# Patient Record
Sex: Female | Born: 1960 | Race: Black or African American | Hispanic: No | Marital: Single | Smoking: Never smoker
Health system: Southern US, Community
[De-identification: ages and names within clinical notes are randomized; demographics above are authoritative.]

## PROBLEM LIST (undated history)

## (undated) DIAGNOSIS — C259 Malignant neoplasm of pancreas, unspecified: Secondary | ICD-10-CM

## (undated) DIAGNOSIS — J45909 Unspecified asthma, uncomplicated: Secondary | ICD-10-CM

## (undated) DIAGNOSIS — C50912 Malignant neoplasm of unspecified site of left female breast: Secondary | ICD-10-CM

## (undated) DIAGNOSIS — C50919 Malignant neoplasm of unspecified site of unspecified female breast: Secondary | ICD-10-CM

## (undated) HISTORY — PX: ABDOMINAL HYSTERECTOMY: SHX81

## (undated) HISTORY — PX: SENTINEL NODE BIOPSY: SHX6608

## (undated) HISTORY — DX: Malignant neoplasm of unspecified site of left female breast: C50.912

---

## 2006-10-17 ENCOUNTER — Ambulatory Visit: Payer: Self-pay | Admitting: Family Medicine

## 2007-06-27 ENCOUNTER — Emergency Department: Payer: Self-pay

## 2007-06-27 ENCOUNTER — Other Ambulatory Visit: Payer: Self-pay

## 2008-01-11 ENCOUNTER — Emergency Department: Payer: Self-pay | Admitting: Emergency Medicine

## 2008-01-11 ENCOUNTER — Other Ambulatory Visit: Payer: Self-pay

## 2008-08-25 ENCOUNTER — Emergency Department: Payer: Self-pay | Admitting: Emergency Medicine

## 2008-09-07 ENCOUNTER — Emergency Department: Payer: Self-pay

## 2009-02-24 ENCOUNTER — Emergency Department: Payer: Self-pay | Admitting: Emergency Medicine

## 2009-10-02 ENCOUNTER — Inpatient Hospital Stay: Payer: Self-pay | Admitting: Internal Medicine

## 2011-12-02 ENCOUNTER — Emergency Department: Payer: Self-pay | Admitting: *Deleted

## 2011-12-02 LAB — CBC
HCT: 34.3 % — ABNORMAL LOW (ref 35.0–47.0)
HGB: 11.4 g/dL — ABNORMAL LOW (ref 12.0–16.0)
MCH: 28.4 pg (ref 26.0–34.0)
MCHC: 33.3 g/dL (ref 32.0–36.0)
MCV: 85 fL (ref 80–100)
RBC: 4.03 10*6/uL (ref 3.80–5.20)
RDW: 15.6 % — ABNORMAL HIGH (ref 11.5–14.5)

## 2011-12-02 LAB — COMPREHENSIVE METABOLIC PANEL
Alkaline Phosphatase: 96 U/L (ref 50–136)
Bilirubin,Total: 0.4 mg/dL (ref 0.2–1.0)
Chloride: 110 mmol/L — ABNORMAL HIGH (ref 98–107)
Co2: 23 mmol/L (ref 21–32)
EGFR (Non-African Amer.): >60
Osmolality: 289 (ref 275–301)
SGOT(AST): 50 U/L — ABNORMAL HIGH (ref 15–37)
SGPT (ALT): 39 U/L
Sodium: 146 mmol/L — ABNORMAL HIGH (ref 136–145)
Total Protein: 7.7 g/dL (ref 6.4–8.2)

## 2011-12-03 LAB — LIPASE, BLOOD: Lipase: 159 U/L (ref 73–393)

## 2012-10-20 ENCOUNTER — Emergency Department: Payer: Self-pay | Admitting: Emergency Medicine

## 2012-10-20 LAB — CBC
HCT: 36 % (ref 35.0–47.0)
MCH: 28.2 pg (ref 26.0–34.0)
MCHC: 33.5 g/dL (ref 32.0–36.0)
Platelet: 215 10*3/uL (ref 150–440)
RBC: 4.28 10*6/uL (ref 3.80–5.20)
RDW: 16.2 % — ABNORMAL HIGH (ref 11.5–14.5)
WBC: 7 10*3/uL (ref 3.6–11.0)

## 2012-10-20 LAB — COMPREHENSIVE METABOLIC PANEL
Albumin: 4 g/dL (ref 3.4–5.0)
Alkaline Phosphatase: 110 U/L (ref 50–136)
Anion Gap: 7 (ref 7–16)
BUN: 8 mg/dL (ref 7–18)
Calcium, Total: 8.9 mg/dL (ref 8.5–10.1)
Chloride: 111 mmol/L — ABNORMAL HIGH (ref 98–107)
Co2: 24 mmol/L (ref 21–32)
EGFR (African American): >60
Glucose: 105 mg/dL — ABNORMAL HIGH (ref 65–99)
SGOT(AST): 45 U/L — ABNORMAL HIGH (ref 15–37)
SGPT (ALT): 35 U/L (ref 12–78)
Sodium: 142 mmol/L (ref 136–145)
Total Protein: 8 g/dL (ref 6.4–8.2)

## 2012-10-20 LAB — URINALYSIS, COMPLETE
Bacteria: NONE SEEN
Bilirubin,UR: NEGATIVE
Glucose,UR: NEGATIVE mg/dL (ref 0–75)
Ketone: NEGATIVE
Leukocyte Esterase: NEGATIVE
Ph: 6 (ref 4.5–8.0)
RBC,UR: NONE SEEN /HPF (ref 0–5)
Squamous Epithelial: 1
WBC UR: NONE SEEN /HPF (ref 0–5)

## 2012-10-20 LAB — DRUG SCREEN, URINE
Amphetamines, Ur Screen: NEGATIVE (ref ?–1000)
Benzodiazepine, Ur Scrn: NEGATIVE (ref ?–200)
Cannabinoid 50 Ng, Ur ~~LOC~~: NEGATIVE (ref ?–50)
Cocaine Metabolite,Ur ~~LOC~~: NEGATIVE (ref ?–300)
MDMA (Ecstasy)Ur Screen: NEGATIVE (ref ?–500)
Phencyclidine (PCP) Ur S: NEGATIVE (ref ?–25)

## 2012-10-20 LAB — ETHANOL: Ethanol: 324 mg/dL

## 2013-01-13 ENCOUNTER — Emergency Department: Payer: Self-pay | Admitting: Emergency Medicine

## 2013-04-12 ENCOUNTER — Emergency Department: Payer: Self-pay | Admitting: Internal Medicine

## 2013-06-02 ENCOUNTER — Emergency Department: Payer: Self-pay | Admitting: Emergency Medicine

## 2013-06-02 LAB — COMPREHENSIVE METABOLIC PANEL
Albumin: 4 g/dL (ref 3.4–5.0)
Anion Gap: 11 (ref 7–16)
BUN: 15 mg/dL (ref 7–18)
Calcium, Total: 9.4 mg/dL (ref 8.5–10.1)
Chloride: 109 mmol/L — ABNORMAL HIGH (ref 98–107)
Co2: 22 mmol/L (ref 21–32)
Creatinine: 0.81 mg/dL (ref 0.60–1.30)
EGFR (African American): >60
EGFR (Non-African Amer.): >60
Glucose: 100 mg/dL — ABNORMAL HIGH (ref 65–99)
Potassium: 3.8 mmol/L (ref 3.5–5.1)
SGOT(AST): 20 U/L (ref 15–37)
SGPT (ALT): 26 U/L (ref 12–78)
Sodium: 142 mmol/L (ref 136–145)
Total Protein: 8.2 g/dL (ref 6.4–8.2)

## 2013-06-02 LAB — CBC
HCT: 37.3 % (ref 35.0–47.0)
HGB: 12.5 g/dL (ref 12.0–16.0)
MCH: 28.7 pg (ref 26.0–34.0)
MCHC: 33.4 g/dL (ref 32.0–36.0)
RBC: 4.34 10*6/uL (ref 3.80–5.20)
RDW: 14.5 % (ref 11.5–14.5)
WBC: 6.8 10*3/uL (ref 3.6–11.0)

## 2013-06-02 LAB — LIPASE, BLOOD: Lipase: 117 U/L (ref 73–393)

## 2013-06-02 LAB — ETHANOL: Ethanol: 299 mg/dL

## 2013-06-02 LAB — TROPONIN I: Troponin-I: <0.02 ng/mL

## 2013-09-27 ENCOUNTER — Emergency Department: Payer: Self-pay | Admitting: Internal Medicine

## 2014-03-31 ENCOUNTER — Emergency Department: Payer: Self-pay | Admitting: Emergency Medicine

## 2014-03-31 LAB — COMPREHENSIVE METABOLIC PANEL
ALK PHOS: 93 U/L
Albumin: 4.2 g/dL (ref 3.4–5.0)
Anion Gap: 5 — ABNORMAL LOW (ref 7–16)
BUN: 14 mg/dL (ref 7–18)
Bilirubin,Total: 0.7 mg/dL (ref 0.2–1.0)
CHLORIDE: 107 mmol/L (ref 98–107)
CO2: 26 mmol/L (ref 21–32)
CREATININE: 0.73 mg/dL (ref 0.60–1.30)
Calcium, Total: 10.1 mg/dL (ref 8.5–10.1)
GLUCOSE: 115 mg/dL — AB (ref 65–99)
OSMOLALITY: 277 (ref 275–301)
POTASSIUM: 3.8 mmol/L (ref 3.5–5.1)
SGOT(AST): 35 U/L (ref 15–37)
SGPT (ALT): 35 U/L
Sodium: 138 mmol/L (ref 136–145)
Total Protein: 8.5 g/dL — ABNORMAL HIGH (ref 6.4–8.2)

## 2014-03-31 LAB — CBC WITH DIFFERENTIAL/PLATELET
BASOS ABS: 0 10*3/uL (ref 0.0–0.1)
BASOS PCT: 0.2 %
Eosinophil #: 0.1 10*3/uL (ref 0.0–0.7)
Eosinophil %: 1.7 %
HCT: 41.9 % (ref 35.0–47.0)
HGB: 13.7 g/dL (ref 12.0–16.0)
LYMPHS PCT: 8.9 %
Lymphocyte #: 0.6 10*3/uL — ABNORMAL LOW (ref 1.0–3.6)
MCH: 28.7 pg (ref 26.0–34.0)
MCHC: 32.6 g/dL (ref 32.0–36.0)
MCV: 88 fL (ref 80–100)
MONO ABS: 0.3 x10 3/mm (ref 0.2–0.9)
MONOS PCT: 4 %
NEUTROS PCT: 85.2 %
Neutrophil #: 6 10*3/uL (ref 1.4–6.5)
PLATELETS: 217 10*3/uL (ref 150–440)
RBC: 4.76 10*6/uL (ref 3.80–5.20)
RDW: 14.8 % — AB (ref 11.5–14.5)
WBC: 7 10*3/uL (ref 3.6–11.0)

## 2014-03-31 LAB — URINALYSIS, COMPLETE
Bacteria: NONE SEEN
Bilirubin,UR: NEGATIVE
Glucose,UR: NEGATIVE mg/dL (ref 0–75)
LEUKOCYTE ESTERASE: NEGATIVE
Nitrite: NEGATIVE
PH: 5 (ref 4.5–8.0)
PROTEIN: NEGATIVE
RBC,UR: 1 /HPF (ref 0–5)
SPECIFIC GRAVITY: 1.025 (ref 1.003–1.030)
Squamous Epithelial: 1
WBC UR: <1 /HPF (ref 0–5)

## 2014-03-31 LAB — LIPASE, BLOOD: Lipase: 94 U/L (ref 73–393)

## 2014-03-31 LAB — TROPONIN I: Troponin-I: <0.02 ng/mL

## 2014-04-19 ENCOUNTER — Ambulatory Visit: Payer: Self-pay | Admitting: Cardiovascular Disease

## 2014-04-19 ENCOUNTER — Encounter: Payer: Self-pay | Admitting: *Deleted

## 2014-07-09 DIAGNOSIS — C50919 Malignant neoplasm of unspecified site of unspecified female breast: Secondary | ICD-10-CM

## 2014-07-09 HISTORY — DX: Malignant neoplasm of unspecified site of unspecified female breast: C50.919

## 2014-07-28 ENCOUNTER — Emergency Department: Payer: Self-pay | Admitting: Emergency Medicine

## 2014-07-28 LAB — CBC
HCT: 41.2 % (ref 35.0–47.0)
HGB: 13.2 g/dL (ref 12.0–16.0)
MCH: 28.1 pg (ref 26.0–34.0)
MCHC: 32.1 g/dL (ref 32.0–36.0)
MCV: 87 fL (ref 80–100)
Platelet: 208 10*3/uL (ref 150–440)
RBC: 4.71 10*6/uL (ref 3.80–5.20)
RDW: 16.3 % — ABNORMAL HIGH (ref 11.5–14.5)
WBC: 5.3 10*3/uL (ref 3.6–11.0)

## 2014-07-28 LAB — COMPREHENSIVE METABOLIC PANEL
ANION GAP: 7 (ref 7–16)
AST: 86 U/L — AB (ref 15–37)
Albumin: 3.8 g/dL (ref 3.4–5.0)
Alkaline Phosphatase: 89 U/L
BILIRUBIN TOTAL: 0.5 mg/dL (ref 0.2–1.0)
BUN: 7 mg/dL (ref 7–18)
CHLORIDE: 104 mmol/L (ref 98–107)
CREATININE: 0.48 mg/dL — AB (ref 0.60–1.30)
Calcium, Total: 9.8 mg/dL (ref 8.5–10.1)
Co2: 28 mmol/L (ref 21–32)
EGFR (Non-African Amer.): >60
Glucose: 80 mg/dL (ref 65–99)
Osmolality: 274 (ref 275–301)
POTASSIUM: 4.5 mmol/L (ref 3.5–5.1)
SGPT (ALT): 43 U/L
SODIUM: 139 mmol/L (ref 136–145)
TOTAL PROTEIN: 8.4 g/dL — AB (ref 6.4–8.2)

## 2014-07-28 LAB — TROPONIN I

## 2014-07-28 LAB — CK TOTAL AND CKMB (NOT AT ARMC)
CK, TOTAL: 279 U/L — AB (ref 26–192)
CK-MB: 1.7 ng/mL (ref 0.5–3.6)

## 2014-08-24 ENCOUNTER — Ambulatory Visit: Payer: Self-pay

## 2014-09-07 ENCOUNTER — Ambulatory Visit
Admit: 2014-09-07 | Disposition: A | Discharge status: Home / Self Care | Payer: Self-pay | Attending: Internal Medicine | Admitting: Internal Medicine

## 2014-09-27 ENCOUNTER — Ambulatory Visit: Payer: Self-pay | Admitting: Surgery

## 2014-09-30 ENCOUNTER — Ambulatory Visit: Payer: Self-pay | Admitting: Surgery

## 2014-09-30 HISTORY — PX: MASTECTOMY: SHX3

## 2014-10-02 LAB — CREATININE, SERUM
Creatinine: 0.58 mg/dL
EGFR (Non-African Amer.): >60

## 2014-10-07 ENCOUNTER — Emergency Department
Admit: 2014-10-07 | Disposition: A | Discharge status: Home / Self Care | Payer: Self-pay | Admitting: Emergency Medicine

## 2014-10-07 LAB — COMPREHENSIVE METABOLIC PANEL
ALBUMIN: 4 g/dL
ALK PHOS: 68 U/L
Anion Gap: 7 (ref 7–16)
BILIRUBIN TOTAL: 0.2 mg/dL — AB
BUN: 11 mg/dL
CALCIUM: 9.5 mg/dL
CHLORIDE: 110 mmol/L
CREATININE: 0.56 mg/dL
Co2: 24 mmol/L
EGFR (Non-African Amer.): >60
Glucose: 88 mg/dL
Potassium: 4 mmol/L
SGOT(AST): 29 U/L
SGPT (ALT): 28 U/L
SODIUM: 141 mmol/L
TOTAL PROTEIN: 7.4 g/dL

## 2014-10-07 LAB — CBC WITH DIFFERENTIAL/PLATELET
BASOS ABS: 0 10*3/uL (ref 0.0–0.1)
Basophil %: 0.4 %
Eosinophil #: 0.3 10*3/uL (ref 0.0–0.7)
Eosinophil %: 3.5 %
HCT: 37.6 % (ref 35.0–47.0)
HGB: 12.3 g/dL (ref 12.0–16.0)
LYMPHS PCT: 37.8 %
Lymphocyte #: 2.8 10*3/uL (ref 1.0–3.6)
MCH: 28.4 pg (ref 26.0–34.0)
MCHC: 32.7 g/dL (ref 32.0–36.0)
MCV: 87 fL (ref 80–100)
MONOS PCT: 7 %
Monocyte #: 0.5 x10 3/mm (ref 0.2–0.9)
NEUTROS PCT: 51.3 %
Neutrophil #: 3.8 10*3/uL (ref 1.4–6.5)
Platelet: 229 10*3/uL (ref 150–440)
RBC: 4.32 10*6/uL (ref 3.80–5.20)
RDW: 14.9 % — AB (ref 11.5–14.5)
WBC: 7.3 10*3/uL (ref 3.6–11.0)

## 2014-10-08 ENCOUNTER — Ambulatory Visit
Admit: 2014-10-08 | Disposition: A | Discharge status: Home / Self Care | Payer: Self-pay | Attending: Internal Medicine | Admitting: Internal Medicine

## 2014-10-27 ENCOUNTER — Emergency Department: Admit: 2014-10-27 | Disposition: A | Discharge status: Home / Self Care | Payer: Self-pay | Admitting: Student

## 2014-10-27 LAB — COMPREHENSIVE METABOLIC PANEL
ALBUMIN: 4.5 g/dL
ALT: 29 U/L
AST: 58 U/L — AB
Alkaline Phosphatase: 94 U/L
Anion Gap: 13 (ref 7–16)
BUN: 7 mg/dL
Bilirubin,Total: 0.6 mg/dL
Calcium, Total: 9.7 mg/dL
Chloride: 106 mmol/L
Co2: 23 mmol/L
Creatinine: 0.63 mg/dL
EGFR (African American): >60
EGFR (Non-African Amer.): >60
GLUCOSE: 116 mg/dL — AB
POTASSIUM: 4 mmol/L
Sodium: 142 mmol/L
Total Protein: 8.2 g/dL — ABNORMAL HIGH

## 2014-10-27 LAB — ETHANOL: Ethanol: 336 mg/dL

## 2014-10-27 LAB — CBC
HCT: 36.9 % (ref 35.0–47.0)
HGB: 12.2 g/dL (ref 12.0–16.0)
MCH: 28 pg (ref 26.0–34.0)
MCHC: 33 g/dL (ref 32.0–36.0)
MCV: 85 fL (ref 80–100)
Platelet: 179 10*3/uL (ref 150–440)
RBC: 4.35 10*6/uL (ref 3.80–5.20)
RDW: 14.3 % (ref 11.5–14.5)
WBC: 5.9 10*3/uL (ref 3.6–11.0)

## 2014-10-27 LAB — CK TOTAL AND CKMB (NOT AT ARMC)
CK, Total: 238 U/L — ABNORMAL HIGH
CK-MB: 2.3 ng/mL

## 2014-10-27 LAB — TROPONIN I: Troponin-I: <0.03 ng/mL

## 2014-10-27 LAB — PROTIME-INR
INR: 0.9
Prothrombin Time: 12.1 secs

## 2014-10-27 LAB — APTT: Activated PTT: 28.9 secs (ref 23.6–35.9)

## 2014-10-27 LAB — LIPASE, BLOOD: Lipase: 33 U/L

## 2014-11-01 LAB — SURGICAL PATHOLOGY

## 2014-11-07 NOTE — Op Note (Signed)
PATIENT NAME:  Alexis Hernandez, Alexis Hernandez MR#:  [id number] DATE OF BIRTH:  29-Aug-1960  DATE OF PROCEDURE:  09/30/2014  PREOPERATIVE DIAGNOSIS: Left breast carcinoma.   POSTOPERATIVE DIAGNOSIS: Left breast carcinoma.   OPERATION: Left simple mastectomy with sentinel lymph node biopsy x2.    SURGEON:  Rodena Goldmann, III, MD   ASSISTANT: PA S  ANESTHESIA: General.   OPERATIVE PROCEDURE: With the patient in the supine position after induction of appropriate general anesthesia, the patient's left breast was prepped with ChloraPrep and draped with sterile towels. An elliptical incision was made around the nipple and carried down through the subcutaneous tissue with Bovie electrocautery. Traction sutures of 3-0 silk were placed on both flaps. The superior flap was created down to the second intercostal space on the chest wall and the inferior flap down just below the inframammary fold on the chest wall. The breast was swept off the chest wall from medial to lateral, stopping just before the latissimus dorsi muscle. The specimen was marked and sent to pathology.   The axilla was interrogated with the Neoprobe and 2 lymph nodes were identified with significant counts. No remaining counts were identified in the area after the lymph nodes were removed.  A touch prep  did not reveal any evidence of malignancy in the identified lymph nodes. The area was copiously irrigated. The chest wall was sprayed with Evicel.   Drains were placed through the inferior flap, 1 to the axilla, and 1 to the superior flap. Skin incisions were then closed with interrupted vertical mattress sutures of 4-0 nylon. The drain was secured with 3-0 nylon. Compressive dressing applied.   The patient was taken to the recovery room having tolerated the procedure well.   Sponge, instruments and needle counts were correct x2 in the operating room.    ____________________________ Rodena Goldmann III, MD rle:nt D: 09/30/2014 11:45:46  ET T: 09/30/2014 20:56:44 ET JOB#: [id number]  cc: Rodena Goldmann III, MD, <Dictator> Rodena Goldmann MD ELECTRONICALLY SIGNED 10/05/2014 20:47

## 2014-11-07 NOTE — Discharge Summary (Signed)
PATIENT NAME:  Alexis Hernandez, SANCHO MR#:  [id number] DATE OF BIRTH:  1960/07/15  DATE OF ADMISSION:  09/30/2014 DATE OF DISCHARGE:   10/02/2014  BRIEF HISTORY: Ms. Kilcrease is a 54 year old woman with a recently diagnosed small left breast carcinoma. After appropriate preoperative preparation and informed consent, she was taken to surgery on the morning of 09/30/2014, where she underwent a left simple mastectomy with sentinel lymph node biopsy.  Touch preps in the operating room and pathology demonstrated no evidence of any significant malignancy in her lymph nodes. The procedure was otherwise uncomplicated. She had some mild pain control issues and some mild nausea issues postoperatively. At the present time though, she is up, active, tolerating a diet with no complaints.  Wounds look good. There is no sign of any infection. Discharged home today to be followed in the office in 7-10 days' time. Bathing, activity, and driving instructions were given to the patient. She can resume her home medications which include tramadol 50 mg every 4 hours p.r.n. and albuterol inhaler 2 puffs 4 times a day p.r.n.  Her pain medicine will be Percocet 5/325 every 4-6 hours p.r.n. pain.   FINAL DISCHARGE DIAGNOSIS: Breast cancer surgery, mastectomy with sentinel lymph node biopsy.    ____________________________ Rodena Goldmann III, MD rle:tr D: 10/02/2014 11:20:58 ET T: 10/02/2014 13:47:42 ET JOB#: [id number]  cc: Rodena Goldmann III, MD, <Dictator> Rodena Goldmann MD ELECTRONICALLY SIGNED 10/05/2014 20:47

## 2014-11-11 ENCOUNTER — Other Ambulatory Visit: Payer: Self-pay

## 2014-11-11 DIAGNOSIS — C50912 Malignant neoplasm of unspecified site of left female breast: Secondary | ICD-10-CM

## 2014-11-18 ENCOUNTER — Other Ambulatory Visit: Payer: Self-pay

## 2014-11-19 ENCOUNTER — Other Ambulatory Visit: Payer: Self-pay

## 2014-11-19 DIAGNOSIS — C50912 Malignant neoplasm of unspecified site of left female breast: Secondary | ICD-10-CM

## 2014-11-24 ENCOUNTER — Ambulatory Visit: Payer: Self-pay | Attending: Internal Medicine

## 2014-12-02 ENCOUNTER — Emergency Department
Admission: EM | Admit: 2014-12-02 | Discharge: 2014-12-02 | Disposition: A | Discharge status: Home / Self Care | Payer: Medicaid Other | Attending: Emergency Medicine | Admitting: Emergency Medicine

## 2014-12-02 ENCOUNTER — Encounter: Payer: Self-pay | Admitting: Emergency Medicine

## 2014-12-02 DIAGNOSIS — M7052 Other bursitis of knee, left knee: Secondary | ICD-10-CM | POA: Diagnosis not present

## 2014-12-02 DIAGNOSIS — Z880 Allergy status to penicillin: Secondary | ICD-10-CM | POA: Diagnosis not present

## 2014-12-02 DIAGNOSIS — Z79899 Other long term (current) drug therapy: Secondary | ICD-10-CM | POA: Insufficient documentation

## 2014-12-02 DIAGNOSIS — M1712 Unilateral primary osteoarthritis, left knee: Secondary | ICD-10-CM | POA: Insufficient documentation

## 2014-12-02 DIAGNOSIS — Y9389 Activity, other specified: Secondary | ICD-10-CM | POA: Insufficient documentation

## 2014-12-02 DIAGNOSIS — M719 Bursopathy, unspecified: Secondary | ICD-10-CM

## 2014-12-02 DIAGNOSIS — M1990 Unspecified osteoarthritis, unspecified site: Secondary | ICD-10-CM

## 2014-12-02 DIAGNOSIS — M25562 Pain in left knee: Secondary | ICD-10-CM | POA: Diagnosis present

## 2014-12-02 MED ORDER — OXYCODONE-ACETAMINOPHEN 5-325 MG PO TABS: 1.0000 | ORAL_TABLET | ORAL | Status: DC | PRN

## 2014-12-02 MED ORDER — KETOROLAC TROMETHAMINE 10 MG PO TABS: 10.0000 mg | ORAL_TABLET | Freq: Four times a day (QID) | ORAL | Status: DC | PRN

## 2014-12-02 MED ORDER — KETOROLAC TROMETHAMINE 10 MG PO TABS
10.0000 mg | ORAL_TABLET | Freq: Once | ORAL | Status: AC
  Administered 2014-12-02: 10 mg via ORAL

## 2014-12-02 MED ORDER — KETOROLAC TROMETHAMINE 10 MG PO TABS
ORAL_TABLET | ORAL | Status: AC
  Administered 2014-12-02: 10 mg via ORAL
  Filled 2014-12-02: qty 1

## 2014-12-02 NOTE — ED Notes (Signed)
Pt to triage with c/o left knee pain.  States has had pain like this in the past.  No known injury.  Pt states pain started approximately 3 days ago

## 2014-12-02 NOTE — ED Provider Notes (Signed)
CSN: [id number]     Arrival date & time 12/02/14  1819 History   First MD Initiated Contact with Patient 12/02/14 1939     Chief Complaint  Patient presents with  . Knee Pain     (Consider location/radiation/quality/duration/timing/severity/associated sxs/prior Treatment) HPI Patient is had worsening left knee pain states his this started about 3 days ago rates it about a 6 to a 10 out of 10 says she has mild swelling to the front of her knee feels like it's grinding a little bit states that this is bothered her off and on in the past denies any trauma denies any lower extremity swelling leaving her leg still and resting exacerbated with movement and touch to the area no other complaints at this time Past Medical History  Diagnosis Date  . Cancer     breast   Past Surgical History  Procedure Laterality Date  . Mastectomy    . Abdominal hysterectomy     No family history on file. History  Substance Use Topics  . Smoking status: Never Smoker   . Smokeless tobacco: Not on file  . Alcohol Use: No   OB History    No data available     Review of Systems  Constitutional: Negative.   HENT: Negative.   Eyes: Negative.   Respiratory: Negative.   Cardiovascular: Negative.   Gastrointestinal: Negative.   Genitourinary: Negative.   Skin: Negative.   Neurological: Negative.   Hematological: Negative.   All other systems reviewed and are negative.     Allergies  Penicillins  Home Medications   Prior to Admission medications   Medication Sig Start Date End Date Taking? Authorizing Provider  albuterol (PROVENTIL HFA;VENTOLIN HFA) 108 (90 BASE) MCG/ACT inhaler Inhale 2 puffs into the lungs every 6 (six) hours as needed for wheezing or shortness of breath.   Yes Historical Provider, MD  esomeprazole (NEXIUM) 40 MG capsule Take 40 mg by mouth daily at 12 noon.   Yes Historical Provider, MD  traMADol (ULTRAM) 50 MG tablet Take 50 mg by mouth every 6 (six) hours as needed for  moderate pain.   Yes Historical Provider, MD  ketorolac (TORADOL) 10 MG tablet Take 1 tablet (10 mg total) by mouth every 6 (six) hours as needed. 12/02/14   Krystalle Pilkington Luanna Cole Nickalas Mccarrick, PA-C  oxyCODONE-acetaminophen (ROXICET) 5-325 MG per tablet Take 1 tablet by mouth every 4 (four) hours as needed for moderate pain or severe pain. 12/02/14   Caral Whan William C Keayra Graham, PA-C   BP 107/72 mmHg  Pulse 90  Temp(Src) 98 F (36.7 C) (Oral)  Resp 18  Ht 5\' 2"  (1.575 m)  Wt 143 lb (64.864 kg)  BMI 26.15 kg/m2  SpO2 97% Physical Exam Female appearing stated age well-developed well-nourished no acute distress vitals reviewed Head ears eyes nose next throat examinations patient was unremarkable  cardiovascular regular rate and rhythm no murmurs rubs gallops good peripheral pulses throughout extremities Pulmonary lungs clear to auscultation bilaterally Skin appears free of rash disease  Musculoskeletal patient has some prepatellar edema mild crepitus with motion of her knee but full range of motion negative Homans sign bilaterally no pedal edema  Neuro exam nonfocal she is able to ambulate without difficulty  ED Course  Procedures Ace wrap applied left knee)   MDM  The patient with a history of arthritis in her left knee states now she has a little bit of mild swelling prepatellar feels like something is grinding in her knee denies any  trauma she has no calf or lower extremity swelling will start her on NSAID and something for pain as well as recommended ice and compression to the area discussed with her feels like she is developed a bursitis and have her follow-up with orthopedics Final diagnoses:  Bursitis  Arthritis        Jb Dulworth Verdene Rio, PA-C 12/02/14 2030  Nance Pear, MD 12/02/14 2232

## 2015-02-28 ENCOUNTER — Other Ambulatory Visit: Payer: Self-pay | Admitting: *Deleted

## 2015-02-28 ENCOUNTER — Inpatient Hospital Stay: Payer: Medicaid Other | Attending: Internal Medicine

## 2015-02-28 ENCOUNTER — Inpatient Hospital Stay (HOSPITAL_BASED_OUTPATIENT_CLINIC_OR_DEPARTMENT_OTHER): Payer: Medicaid Other | Admitting: Internal Medicine

## 2015-02-28 VITALS — BP 173/82 | HR 56 | Temp 96.5°F | Resp 18 | Ht 62.0 in | Wt 148.6 lb

## 2015-02-28 DIAGNOSIS — Z170 Estrogen receptor positive status [ER+]: Secondary | ICD-10-CM | POA: Diagnosis not present

## 2015-02-28 DIAGNOSIS — Z79811 Long term (current) use of aromatase inhibitors: Secondary | ICD-10-CM

## 2015-02-28 DIAGNOSIS — Z13820 Encounter for screening for osteoporosis: Secondary | ICD-10-CM | POA: Insufficient documentation

## 2015-02-28 DIAGNOSIS — Z801 Family history of malignant neoplasm of trachea, bronchus and lung: Secondary | ICD-10-CM | POA: Insufficient documentation

## 2015-02-28 DIAGNOSIS — R740 Nonspecific elevation of levels of transaminase and lactic acid dehydrogenase [LDH]: Secondary | ICD-10-CM | POA: Insufficient documentation

## 2015-02-28 DIAGNOSIS — Z9012 Acquired absence of left breast and nipple: Secondary | ICD-10-CM | POA: Insufficient documentation

## 2015-02-28 DIAGNOSIS — C50512 Malignant neoplasm of lower-outer quadrant of left female breast: Secondary | ICD-10-CM | POA: Insufficient documentation

## 2015-02-28 DIAGNOSIS — D696 Thrombocytopenia, unspecified: Secondary | ICD-10-CM | POA: Diagnosis not present

## 2015-02-28 DIAGNOSIS — Z79899 Other long term (current) drug therapy: Secondary | ICD-10-CM | POA: Diagnosis not present

## 2015-02-28 DIAGNOSIS — C50912 Malignant neoplasm of unspecified site of left female breast: Secondary | ICD-10-CM

## 2015-02-28 LAB — CBC WITH DIFFERENTIAL/PLATELET
BASOS ABS: 0 10*3/uL (ref 0–0.1)
BASOS PCT: 0 %
EOS PCT: 2 %
Eosinophils Absolute: 0.1 10*3/uL (ref 0–0.7)
HCT: 38.4 % (ref 35.0–47.0)
Hemoglobin: 12.8 g/dL (ref 12.0–16.0)
Lymphocytes Relative: 37 %
Lymphs Abs: 2 10*3/uL (ref 1.0–3.6)
MCH: 28.8 pg (ref 26.0–34.0)
MCHC: 33.4 g/dL (ref 32.0–36.0)
MCV: 86.2 fL (ref 80.0–100.0)
MONO ABS: 0.4 10*3/uL (ref 0.2–0.9)
Monocytes Relative: 8 %
Neutro Abs: 2.9 10*3/uL (ref 1.4–6.5)
Neutrophils Relative %: 53 %
PLATELETS: 128 10*3/uL — AB (ref 150–440)
RBC: 4.46 MIL/uL (ref 3.80–5.20)
RDW: 13.8 % (ref 11.5–14.5)
WBC: 5.4 10*3/uL (ref 3.6–11.0)

## 2015-02-28 LAB — HEPATIC FUNCTION PANEL
ALBUMIN: 4.4 g/dL (ref 3.5–5.0)
ALT: 28 U/L (ref 14–54)
AST: 42 U/L — AB (ref 15–41)
Alkaline Phosphatase: 77 U/L (ref 38–126)
Bilirubin, Direct: <0.1 mg/dL — ABNORMAL LOW (ref 0.1–0.5)
TOTAL PROTEIN: 8 g/dL (ref 6.5–8.1)
Total Bilirubin: 0.7 mg/dL (ref 0.3–1.2)

## 2015-02-28 LAB — CREATININE, SERUM: Creatinine, Ser: 0.66 mg/dL (ref 0.44–1.00)

## 2015-02-28 NOTE — Progress Notes (Signed)
Patient states that she has a constant pain/ache on her left side. She rates the pain a 8/10 because it is constant. She also states that her appetite has been up and down and she has been having trouble sleeping. She states that her last mammogram was in March 2016.

## 2015-03-07 ENCOUNTER — Ambulatory Visit: Payer: Medicaid Other | Attending: Internal Medicine

## 2015-03-15 ENCOUNTER — Ambulatory Visit
Admission: RE | Admit: 2015-03-15 | Discharge: 2015-03-15 | Disposition: A | Discharge status: Home / Self Care | Payer: Medicaid Other | Source: Ambulatory Visit | Attending: Internal Medicine | Admitting: Internal Medicine

## 2015-03-15 DIAGNOSIS — M8580 Other specified disorders of bone density and structure, unspecified site: Secondary | ICD-10-CM | POA: Diagnosis not present

## 2015-03-15 DIAGNOSIS — Z13820 Encounter for screening for osteoporosis: Secondary | ICD-10-CM

## 2015-03-15 DIAGNOSIS — C50912 Malignant neoplasm of unspecified site of left female breast: Secondary | ICD-10-CM

## 2015-03-16 NOTE — Progress Notes (Signed)
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  Initially went to emergency room for left-sided chest pain, and CT chest showed a 2.4 cm breast mass following which she had mammogram and ultrasound of the left breast on February 16 which measured the mass at 13 x 9 x 8 mm size with no axillary lymphadenopathy.  09/30/14 - Simple Mastectomy and Sentinel Lymph Node Study: Surgical pathology reported multifocal pT1b (6 mm) pN0 (sn) grade 2 invasive ductal carcinoma with uninvolved margins, DCIS present with uninvolved margins.  10/13/14 Oncotype DX assay reports recurrence score of 4, 10-year risk of distant recurrence is around 4%. Started adjuvant hormonal therapy with anastrozole in May 2016.   HISTORY OF PRESENT ILLNESS:  Patient returns for continued oncology follow-up, she was last seen a few months ago. Patient is status post simple mastectomy and sentinel lymph node study in March. She was started on anastrozole in May, states that she is tolerating this without any new side effects. Denies any bothersome hot flashes. No joint stiffness or new joint pains. No new bone pains. No new dyspnea, cough or chest pain. Remains physically active. Denies feeling any breast masses on self exam.  REVIEW OF SYSTEMS:   ROS As in HPI above. In addition, no fevers or sweats. No new headaches or focal weakness.  No sore throat, cough, shortness of breath, sputum, hemoptysis or chest pain. No dizziness or palpitation. No abdominal pain, constipation, diarrhea, dysuria or hematuria. No new skin rash or bleeding symptoms. No new paresthesias in  extremities.   PAST MEDICAL HISTORY: Reviewed. Past Medical History  Diagnosis Date  . Cancer     breast    PAST SURGICAL HISTORY: Reviewed. Past Surgical History  Procedure Laterality Date  . Mastectomy    . Abdominal hysterectomy      FAMILY HISTORY: Reviewed. Father had lung cancer, cousin had throat cancer. Otherwise denies any history of breast, colon, or ovarian malignancies in the family.  SOCIAL HISTORY: Reviewed. Social History  Substance Use Topics  . Smoking status: Never Smoker   . Smokeless tobacco: Not on file  . Alcohol Use: No    Allergies  Allergen Reactions  . Penicillins Swelling    Current Outpatient Prescriptions  Medication Sig Dispense Refill  . albuterol (PROVENTIL HFA;VENTOLIN HFA) 108 (90 BASE) MCG/ACT inhaler Inhale 2 puffs into the lungs every 6 (six) hours as needed for wheezing or shortness of breath.    . esomeprazole (NEXIUM) 40 MG capsule Take 40 mg by mouth daily at 12 noon.    Marland Kitchen ketorolac (TORADOL) 10 MG tablet Take 1 tablet (10 mg total) by mouth every 6 (six) hours as needed. (Patient not taking: Reported on 02/28/2015) 20 tablet 0  . oxyCODONE-acetaminophen (ROXICET) 5-325 MG per tablet Take 1 tablet by mouth every 4 (four) hours as needed for moderate pain or severe pain. (Patient not taking: Reported on 02/28/2015) 10 tablet 0  . traMADol (ULTRAM) 50 MG tablet Take 50 mg by mouth every 6 (six) hours as needed for moderate pain.     No current facility-administered medications for this visit.    PHYSICAL EXAM: Filed  Vitals:   02/28/15 1029  BP: 173/82  Pulse: 56  Temp: 96.5 F (35.8 C)  Resp: 18     Body mass index is 27.17 kg/(m^2).    ECOG FS:0 - Asymptomatic  GENERAL: Patient is alert and oriented and in no acute distress. There is no icterus. HEENT: EOMs intact. No cervical lymphadenopathy. CVS: S1S2, regular LUNGS: Bilaterally clear to auscultation, no rhonchi. ABDOMEN: Soft, nontender. No hepatomegaly clinically.    NEURO: grossly nonfocal, cranial nerves are intact.  EXTREMITIES: No pedal edema. BREASTS: no dominant masses palpable on either side. No axillary adenopathy on either side. Exam performed in presence of a nurse.   LAB RESULTS:    Component Value Date/Time   NA 142 10/27/2014 2153   K 4.0 10/27/2014 2153   CL 106 10/27/2014 2153   CO2 23 10/27/2014 2153   GLUCOSE 116* 10/27/2014 2153   BUN 7 10/27/2014 2153   CREATININE 0.66 02/28/2015 1004   CREATININE 0.63 10/27/2014 2153   CALCIUM 9.7 10/27/2014 2153   PROT 8.0 02/28/2015 1004   PROT 8.2* 10/27/2014 2153   ALBUMIN 4.4 02/28/2015 1004   ALBUMIN 4.5 10/27/2014 2153   AST 42* 02/28/2015 1004   AST 58* 10/27/2014 2153   ALT 28 02/28/2015 1004   ALT 29 10/27/2014 2153   ALKPHOS 77 02/28/2015 1004   ALKPHOS 94 10/27/2014 2153   BILITOT 0.7 02/28/2015 1004   BILITOT 0.6 10/27/2014 2153   GFRNONAA >60 02/28/2015 1004   GFRNONAA >60 10/27/2014 2153   GFRNONAA >60 07/28/2014 1715   GFRAA >60 02/28/2015 1004   GFRAA >60 10/27/2014 2153   GFRAA >60 07/28/2014 1715    Lab Results  Component Value Date   WBC 5.4 02/28/2015   NEUTROABS 2.9 02/28/2015   HGB 12.8 02/28/2015   HCT 38.4 02/28/2015   MCV 86.2 02/28/2015   PLT 128* 02/28/2015     STUDIES: 07/28/14 - CT chest. IMPRESSION: There is a left breast lesion along the inferior aspect, measuring up to 2.4 cm. Recommend further evaluation with mammography. No evidence for a chest wall mass. No acute cardiopulmonary findings.  08/24/14 - Left US/mammogram. Targeted ultrasound is performed, showing a hypoechoic somewhat ill-defined oval mass in the 4 o'clock position of the left breast, 5 cm from the nipple, corresponding to the palpable abnormality. This measures 8 mm x 9 mm by 13 mm in size. There other vague areas of heterogeneous echogenicity without an additional discrete mass. There are no axillary masses or abnormal appearing lymph nodes.   IMPRESSION: Findings are  suspicious for breast carcinoma with an abnormal lobulated area density in the inferior lateral left breast, associated pleomorphic calcification and an abnormal appearing hypoechoic lesion on ultrasound. Biopsy is indicated. RECOMMENDATION: Ultrasound-guided core needle biopsy of the hypoechoic mass in the left breast 4 o'clock, 5 cm from the nipple.  08/26/14 - Left breast core biopsy. DIAGNOSIS: A. BREAST, LEFT AT 4:00; ULTRASOUND GUIDED CORE BIOPSY: INVASIVE MAMMARY CARCINOMA, NO SPECIAL TYPE WITH PROMINENT NECROSIS. INDETERMINATE FOR ANGIOLYMPHATIC INVASION. ER+, PR+, Her2neu negative (1+ on IHC).  09/30/14 - Simple Mastectomy and SLN study Surgical Pathology report. DIAGNOSIS: A. LEFT BREAST; SIMPLE MASTECTOMY: MULTIFOCAL INVASIVE MAMMARY CARCINOMA, LARGEST FOCUS MEASURING 6 MM. EXTENSIVE DUCTAL CARCINOMA IN SITU, SOLID, CRIBRIFORM, MICROPAPILLARY TYPES WITH MICROCALCIFICATIONS. THE MARGINS OF EXCISION ARE NEGATIVE. SEE CANCER CASE SUMMARY BELOW. NO TUMOR SEEN IN ONE LATERAL INTRAMAMMARY LYMPH NODE (0/1). B. LYMPH NODE, SENTINEL 1 AND 2; EXCISION:  NO TUMOR SEEN IN TWO SENTINEL LYMPH NODES (  0/1). Histologic Type: Invasive ductal carcinoma (no special type or not otherwise specified). Histologic Grade:  Overall Grade 2. DCIS is present Size of Largest Invasive Carcinoma: 0.6cm Tumor Focality: Multiple foci of invasive carcinoma Invasive Carcinoma: Margins uninvolved by invasive carcinoma. Distance from Closest Margin: 89m. Ductal Carcinoma In Situ (DCIS):   Margins uninvolved by DCIS. istance of DCIS from Closest Margin is < 1 mm. LYMPH NODES: Total Number of Lymph Nodes Examined: 3 Micro / Macro Metastases: Not identified. Number of Lymph Nodes with Isolated Tumor Cells (<= 0.2 mm and <= 200 cells): None Number of Sentinel Lymph Nodes Examined: 2. ACCESSORY FINDINGS Lymph-Vascular Invasion: Not Identified STAGE (pTNM):  m (multiple foci of invasive carcinoma) Primary Tumor (Invasive Carcinoma)  (pT): pT1b:  Tumor > 5 mm but <= 10 mm in greatest dimension. Regional Lymph Nodes: pN0: No regional lymph node metastasis identified.    ASSESSMENT / PLAN:   1. Stage I left breast invasive mammary carcinoma grade 2 on biopsy, ER/PR positive and HER2/neu negative (1+ on IHC by core biopsy of left breast mass located at 4 o'clock position done on 08/26/14). On 09/30/14 - Simple Mastectomy and Sentinel Lymph Node Study: Surgical pathology reported multifocal pT1b (6 mm) pN0 (sn) grade 2 invasive ductal carcinoma with uninvolved margins, DCIS present with uninvolved margins. On 10/13/14 Oncotype DX assay reported recurrence score of 4, 10-year risk of distant recurrence is around 4%. Started hormonal therapy with anastrozole in May 2016  -  reviewed labs from today and discussed with patient. Clinically doing steady, tolerating anastrozole without major side effects, continue this 1 mg by mouth daily. Patient explained that this is planned for total course of 5 years. Clinically no evidence to suggest recurrent/metastatic breast cancer, plan is continued surveillance and will see her back in about 6 months with repeat labs, will obtain right breast mammogram prior to this visit. 2. Will get DEXA scan for Osteoporosis surveillance, patient on high-risk medication (on aromatase inhibitor). She was reminded to continue to take calcium plus vitamin D supplement twice daily. 3. Thrombocytopenia, mildly elevated liver transaminases - patient gives history of regular alcohol intake which could be a possible explanation for these findings, I have recommended workup to evaluate for other etiologies. Patient wants to hold off on this and states that she will quit alcohol intake. Will repeat labs in 4 weeks including CBC and LFT and if there is persistent abnormalities she will need further workup. 4. In between visits, patient advised to call in case of any new breast masses felt on self-exam, side effects from anastrozole  or other symptoms and will be evaluated sooner. She is agreeable to this plan.     SLeia Alf MD   03/16/2015 8:29 AM

## 2015-03-18 ENCOUNTER — Telehealth: Payer: Self-pay | Admitting: *Deleted

## 2015-03-18 MED ORDER — CALCIUM-VITAMIN D 250-125 MG-UNIT PO TABS: 1.0000 | ORAL_TABLET | Freq: Every day | ORAL | Status: AC

## 2015-03-18 NOTE — Telephone Encounter (Signed)
Called inquiring about dexa scan results. I informed her that she is osteopenic and she is to take Calium + Vitamin D 2 times a day every day. She asked if it is OTC and I told her yes and that she can have her pharmacist help her choose a combination of the two so that she does not have to have 2 different pills I reccommended Oscal generic to her. She had me to sell osteopenic for her

## 2015-03-28 ENCOUNTER — Inpatient Hospital Stay: Payer: Medicaid Other | Attending: Internal Medicine

## 2015-03-28 DIAGNOSIS — Z13820 Encounter for screening for osteoporosis: Secondary | ICD-10-CM

## 2015-03-28 DIAGNOSIS — C50912 Malignant neoplasm of unspecified site of left female breast: Secondary | ICD-10-CM

## 2015-03-28 LAB — CBC WITH DIFFERENTIAL/PLATELET
BASOS ABS: 0 10*3/uL (ref 0–0.1)
Basophils Relative: 1 %
Eosinophils Absolute: 0.2 10*3/uL (ref 0–0.7)
Eosinophils Relative: 4 %
HEMATOCRIT: 37.7 % (ref 35.0–47.0)
HEMOGLOBIN: 12.7 g/dL (ref 12.0–16.0)
LYMPHS PCT: 33 %
Lymphs Abs: 2 10*3/uL (ref 1.0–3.6)
MCH: 28.3 pg (ref 26.0–34.0)
MCHC: 33.6 g/dL (ref 32.0–36.0)
MCV: 84.2 fL (ref 80.0–100.0)
Monocytes Absolute: 0.3 10*3/uL (ref 0.2–0.9)
Monocytes Relative: 5 %
NEUTROS ABS: 3.5 10*3/uL (ref 1.4–6.5)
Neutrophils Relative %: 57 %
Platelets: 187 10*3/uL (ref 150–440)
RBC: 4.48 MIL/uL (ref 3.80–5.20)
RDW: 13.4 % (ref 11.5–14.5)
WBC: 6.1 10*3/uL (ref 3.6–11.0)

## 2015-03-28 LAB — HEPATIC FUNCTION PANEL
ALK PHOS: 80 U/L (ref 38–126)
ALT: 27 U/L (ref 14–54)
AST: 26 U/L (ref 15–41)
Albumin: 4.3 g/dL (ref 3.5–5.0)
BILIRUBIN TOTAL: 0.4 mg/dL (ref 0.3–1.2)
Total Protein: 7.8 g/dL (ref 6.5–8.1)

## 2015-08-12 ENCOUNTER — Emergency Department
Admission: EM | Admit: 2015-08-12 | Discharge: 2015-08-13 | Disposition: A | Discharge status: Home / Self Care | Payer: Medicaid Other | Attending: Emergency Medicine | Admitting: Emergency Medicine

## 2015-08-12 ENCOUNTER — Encounter: Payer: Self-pay | Admitting: Emergency Medicine

## 2015-08-12 DIAGNOSIS — W19XXXA Unspecified fall, initial encounter: Secondary | ICD-10-CM

## 2015-08-12 DIAGNOSIS — S59901A Unspecified injury of right elbow, initial encounter: Secondary | ICD-10-CM | POA: Diagnosis not present

## 2015-08-12 DIAGNOSIS — Y9389 Activity, other specified: Secondary | ICD-10-CM | POA: Insufficient documentation

## 2015-08-12 DIAGNOSIS — Y9289 Other specified places as the place of occurrence of the external cause: Secondary | ICD-10-CM | POA: Diagnosis not present

## 2015-08-12 DIAGNOSIS — R52 Pain, unspecified: Secondary | ICD-10-CM

## 2015-08-12 DIAGNOSIS — W1839XA Other fall on same level, initial encounter: Secondary | ICD-10-CM | POA: Insufficient documentation

## 2015-08-12 DIAGNOSIS — S3991XA Unspecified injury of abdomen, initial encounter: Secondary | ICD-10-CM | POA: Insufficient documentation

## 2015-08-12 DIAGNOSIS — Y998 Other external cause status: Secondary | ICD-10-CM | POA: Insufficient documentation

## 2015-08-12 DIAGNOSIS — S2242XA Multiple fractures of ribs, left side, initial encounter for closed fracture: Secondary | ICD-10-CM | POA: Diagnosis not present

## 2015-08-12 DIAGNOSIS — S2232XA Fracture of one rib, left side, initial encounter for closed fracture: Secondary | ICD-10-CM

## 2015-08-12 DIAGNOSIS — Z880 Allergy status to penicillin: Secondary | ICD-10-CM | POA: Diagnosis not present

## 2015-08-12 DIAGNOSIS — Z79899 Other long term (current) drug therapy: Secondary | ICD-10-CM | POA: Insufficient documentation

## 2015-08-12 DIAGNOSIS — S29001A Unspecified injury of muscle and tendon of front wall of thorax, initial encounter: Secondary | ICD-10-CM | POA: Diagnosis present

## 2015-08-12 DIAGNOSIS — S8992XA Unspecified injury of left lower leg, initial encounter: Secondary | ICD-10-CM | POA: Diagnosis not present

## 2015-08-12 HISTORY — DX: Unspecified asthma, uncomplicated: J45.909

## 2015-08-12 LAB — COMPREHENSIVE METABOLIC PANEL
ALK PHOS: 86 U/L (ref 38–126)
ALT: 30 U/L (ref 14–54)
AST: 41 U/L (ref 15–41)
Albumin: 4.5 g/dL (ref 3.5–5.0)
Anion gap: 13 (ref 5–15)
BUN: 7 mg/dL (ref 6–20)
CALCIUM: 9.8 mg/dL (ref 8.9–10.3)
CO2: 21 mmol/L — AB (ref 22–32)
CREATININE: 0.53 mg/dL (ref 0.44–1.00)
Chloride: 107 mmol/L (ref 101–111)
GFR calc non Af Amer: >60 mL/min (ref >60)
Glucose, Bld: 105 mg/dL — ABNORMAL HIGH (ref 65–99)
Potassium: 4.2 mmol/L (ref 3.5–5.1)
SODIUM: 141 mmol/L (ref 135–145)
Total Bilirubin: 0.6 mg/dL (ref 0.3–1.2)
Total Protein: 8.6 g/dL — ABNORMAL HIGH (ref 6.5–8.1)

## 2015-08-12 LAB — CBC WITH DIFFERENTIAL/PLATELET
BASOS PCT: 1 %
Basophils Absolute: 0 10*3/uL (ref 0–0.1)
EOS ABS: 0.2 10*3/uL (ref 0–0.7)
EOS PCT: 3 %
HCT: 38.7 % (ref 35.0–47.0)
HEMOGLOBIN: 12.8 g/dL (ref 12.0–16.0)
LYMPHS ABS: 3.3 10*3/uL (ref 1.0–3.6)
Lymphocytes Relative: 51 %
MCH: 27.8 pg (ref 26.0–34.0)
MCHC: 33.1 g/dL (ref 32.0–36.0)
MCV: 84 fL (ref 80.0–100.0)
MONO ABS: 0.4 10*3/uL (ref 0.2–0.9)
MONOS PCT: 6 %
NEUTROS PCT: 39 %
Neutro Abs: 2.4 10*3/uL (ref 1.4–6.5)
PLATELETS: 208 10*3/uL (ref 150–440)
RBC: 4.61 MIL/uL (ref 3.80–5.20)
RDW: 16.7 % — AB (ref 11.5–14.5)
WBC: 6.3 10*3/uL (ref 3.6–11.0)

## 2015-08-12 LAB — TROPONIN I: Troponin I: <0.03 ng/mL (ref <0.031)

## 2015-08-12 MED ORDER — IOHEXOL 240 MG/ML SOLN
25.0000 mL | Freq: Once | INTRAMUSCULAR | Status: AC | PRN
  Administered 2015-08-12: 25 mL via ORAL

## 2015-08-12 MED ORDER — ONDANSETRON HCL 4 MG/2ML IJ SOLN
4.0000 mg | Freq: Once | INTRAMUSCULAR | Status: AC
  Administered 2015-08-12: 4 mg via INTRAVENOUS
  Filled 2015-08-12: qty 2

## 2015-08-12 MED ORDER — HYDROMORPHONE HCL 1 MG/ML IJ SOLN
0.5000 mg | Freq: Once | INTRAMUSCULAR | Status: AC
  Administered 2015-08-12: 0.5 mg via INTRAVENOUS
  Filled 2015-08-12: qty 1

## 2015-08-12 MED ORDER — HYDROMORPHONE HCL 1 MG/ML IJ SOLN
0.5000 mg | Freq: Once | INTRAMUSCULAR | Status: AC
  Administered 2015-08-12: 0.5 mg via INTRAVENOUS

## 2015-08-12 NOTE — ED Provider Notes (Signed)
[facility] Emergency Department Provider Note  ____________________________________________  Time seen: Approximately 10:57 PM  I have reviewed the triage vital signs and the nursing notes.   HISTORY  Chief Complaint Rib Injury    HPI Alexis Hernandez is a 55 y.o. female patient reports she fell 2 days ago and is complaining of severe pain in the left side right at the bottom of the ribs. This worse with movement or deep breathing hurts to touch. Patient does not remember any bruising. Patient also reports she fell and injured her right elbow and it hurts and is difficult to bend it completely. She reports she fell because her left knee gave way on her wrist on this before and she says there is a swelling behind the knee is been there for some time.   Past Medical History  Diagnosis Date  . Cancer ([place])     breast  . Asthma     There are no active problems to display for this patient.   Past Surgical History  Procedure Laterality Date  . Mastectomy    . Abdominal hysterectomy      Current Outpatient Rx  Name  Route  Sig  Dispense  Refill  . albuterol (PROVENTIL HFA;VENTOLIN HFA) 108 (90 BASE) MCG/ACT inhaler   Inhalation   Inhale 2 puffs into the lungs every 6 (six) hours as needed for wheezing or shortness of breath.         . calcium-vitamin D (OSCAL) 250-125 MG-UNIT per tablet   Oral   Take 1 tablet by mouth daily.   30 tablet   0   . esomeprazole (NEXIUM) 40 MG capsule   Oral   Take 40 mg by mouth daily at 12 noon.         Marland Kitchen ketorolac (TORADOL) 10 MG tablet   Oral   Take 1 tablet (10 mg total) by mouth every 6 (six) hours as needed.   20 tablet   0   . oxyCODONE-acetaminophen (ROXICET) 5-325 MG per tablet   Oral   Take 1 tablet by mouth every 4 (four) hours as needed for moderate pain or severe pain.   10 tablet   0   . traMADol (ULTRAM) 50 MG tablet   Oral   Take 50 mg by mouth every 6 (six) hours as needed for moderate  pain.           Allergies Penicillins  History reviewed. No pertinent family history.  Social History Social History  Substance Use Topics  . Smoking status: Never Smoker   . Smokeless tobacco: None  . Alcohol Use: Yes    Review of Systems Constitutional: No fever/chills Eyes: No visual changes. ENT: No sore throat. Cardiovascular:  chest pain from the fall. Respiratory: Denies shortness of breath. Gastrointestinal:  abdominal pain.  No nausea, no vomiting.  No diarrhea.  No constipation. Genitourinary: Negative for dysuria. Musculoskeletal: Negative for back pain. Skin: Negative for rash. Neurological: Negative for headaches, focal weakness or numbness. } 10-point ROS otherwise negative.  ____________________________________________   PHYSICAL EXAM:  VITAL SIGNS: ED Triage Vitals  Enc Vitals Group     BP 08/12/15 2244 144/70 mmHg     Pulse Rate 08/12/15 2244 78     Resp 08/12/15 2244 13     Temp 08/12/15 2244 97.6 F (36.4 C)     Temp Source 08/12/15 2244 Oral     SpO2 08/12/15 2244 94 %     Weight 08/12/15 2244 150  lb (68.04 kg)     Height 08/12/15 2244 5\' 2"  (1.575 m)     Head Cir --      Peak Flow --      Pain Score 08/12/15 2245 8     Pain Loc --      Pain Edu? --      Excl. in [place]? --     Constitutional: Alert and oriented. Well appearing and in acute distress. Eyes: Conjunctivae are normal. PERRL. EOMI. Head: Atraumatic. Nose: No congestion/rhinnorhea. Mouth/Throat: Mucous membranes are moist.  Oropharynx non-erythematous. Neck: No stridor.  No cervical spine tenderness to palpation. Cardiovascular: Normal rate, regular rhythm. Grossly normal heart sounds.  Good peripheral circulation. Respiratory: Normal respiratory effort.  No retractions. Lungs CTAB. Left side of the chest is tender from approximately the fourth or fifth rib down Gastrointestinal: Soft tender in the left upper quadrant and her niece is quite severe No distention. No abdominal  bruits. Left CVA tenderness as well CVA tenderness. Musculoskeletal: No lower extremity tenderness nor edema.  No joint effusions. Right elbow is difficult to flex completely. Pulses intact in the wrist patient does seem to have a little bit of swelling behind the left knee. The knee is otherwise normal looking Neurologic:  Normal speech and language. No gross focal neurologic deficits are appreciated. No gait instability. Skin:  Skin is warm, dry and intact. No rash noted.   ____________________________________________   LABS (all labs ordered are listed, but only abnormal results are displayed)  Labs Reviewed  COMPREHENSIVE METABOLIC PANEL - Abnormal; Notable for the following:    CO2 21 (*)    Glucose, Bld 105 (*)    Total Protein 8.6 (*)    All other components within normal limits  CBC WITH DIFFERENTIAL/PLATELET - Abnormal; Notable for the following:    RDW 16.7 (*)    All other components within normal limits  TROPONIN I  URINALYSIS COMPLETEWITH MICROSCOPIC ([facility] ONLY)   ____________________________________________  EKG  EKG read and charted by me shows normal sinus rhythm rate of 75 normal axis essentially normal EKG ____________________________________________  RADIOLOGY  Pending at present ____________________________________________   PROCEDURES    ____________________________________________   INITIAL IMPRESSION / ASSESSMENT AND PLAN / ED COURSE  Pertinent labs & imaging results that were available during my care of the patient were reviewed by me and considered in my medical decision making (see chart for details).  Dr. Nancy Fetter will check the CT report which is not back and the knee and elbow x-rays ____________________________________________   FINAL CLINICAL IMPRESSION(S) / ED DIAGNOSES  Final diagnoses:  Pain      [name], MD 08/13/15 [phone]

## 2015-08-12 NOTE — ED Notes (Signed)
Pt taken to CT.
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
## 2015-08-13 ENCOUNTER — Emergency Department: Payer: Medicaid Other

## 2015-08-13 MED ORDER — IBUPROFEN 600 MG PO TABS: 600.0000 mg | ORAL_TABLET | Freq: Three times a day (TID) | ORAL | Status: AC | PRN

## 2015-08-13 MED ORDER — MORPHINE SULFATE (PF) 4 MG/ML IV SOLN
INTRAVENOUS | Status: AC
  Administered 2015-08-13: 4 mg via INTRAVENOUS
  Filled 2015-08-13: qty 1

## 2015-08-13 MED ORDER — MORPHINE SULFATE (PF) 4 MG/ML IV SOLN
4.0000 mg | Freq: Once | INTRAVENOUS | Status: AC
  Administered 2015-08-13: 4 mg via INTRAVENOUS

## 2015-08-13 MED ORDER — IOHEXOL 350 MG/ML SOLN
100.0000 mL | Freq: Once | INTRAVENOUS | Status: AC | PRN
  Administered 2015-08-13: 100 mL via INTRAVENOUS

## 2015-08-13 MED ORDER — ONDANSETRON HCL 4 MG/2ML IJ SOLN
INTRAMUSCULAR | Status: AC
  Administered 2015-08-13: 4 mg via INTRAVENOUS
  Filled 2015-08-13: qty 2

## 2015-08-13 MED ORDER — OXYCODONE-ACETAMINOPHEN 5-325 MG PO TABS: 1.0000 | ORAL_TABLET | ORAL | Status: DC | PRN

## 2015-08-13 MED ORDER — ONDANSETRON HCL 4 MG/2ML IJ SOLN
4.0000 mg | Freq: Once | INTRAMUSCULAR | Status: AC
  Administered 2015-08-13: 4 mg via INTRAVENOUS

## 2015-08-13 NOTE — ED Provider Notes (Signed)
-----------------------------------------   1:23 AM on 08/13/2015 -----------------------------------------  CT chest, abdomen and pelvis interpreted per Dr. Marisue Humble: Acute nondisplaced fractures posterior lateral left tenth and eleventh ribs. No associated acute intrathoracic or left upper quadrant abnormality. No splenic injury or pneumothorax.  Left knee xrays (viewed by me, interpreted per Dr. Pascal Lux): Negative.  Right elbow xrays (viewed by me, interpreted per Dr. Pascal Lux): Negative.  Updated patient of CT and xray results. Plan for analgesia, incentive spirometer, close follow-up with PCP next week. Return precautions given. Patient verbalizes understanding and agrees with plan of care.  Paulette Blanch, MD 08/13/15 [phone]

## 2015-08-13 NOTE — ED Notes (Signed)
IS reviewed w/ pt.  Pt states she is familiar from post-op last year

## 2015-08-13 NOTE — Discharge Instructions (Signed)
1. You may take pain medicines as needed (Motrin/Percocet). 2. Apply ice to affected area several times daily. 3. Use incentive spirometer as directed. 4. Return to the ER for worsening symptoms, persistent vomiting, difficulty breathing, fever, or other concerns.  Blunt Chest Trauma Blunt chest trauma is an injury caused by a blow to the chest. These chest injuries can be very painful. Blunt chest trauma often results in bruised or broken (fractured) ribs. Most cases of bruised and fractured ribs from blunt chest traumas get better after 1 to 3 weeks of rest and pain medicine. Often, the soft tissue in the chest wall is also injured, causing pain and bruising. Internal organs, such as the heart and lungs, may also be injured. Blunt chest trauma can lead to serious medical problems. This injury requires immediate medical care. CAUSES   Motor vehicle collisions.  Falls.  Physical violence.  Sports injuries. SYMPTOMS   Chest pain. The pain may be worse when you move or breathe deeply.  Shortness of breath.  Lightheadedness.  Bruising.  Tenderness.  Swelling. DIAGNOSIS  Your caregiver will do a physical exam. X-rays may be taken to look for fractures. However, minor rib fractures may not show up on X-rays until a few days after the injury. If a more serious injury is suspected, further imaging tests may be done. This may include ultrasounds, computed tomography (CT) scans, or magnetic resonance imaging (MRI). TREATMENT  Treatment depends on the severity of your injury. Your caregiver may prescribe pain medicines and deep breathing exercises. HOME CARE INSTRUCTIONS  Limit your activities until you can move around without much pain.  Do not do any strenuous work until your injury is healed.  Put ice on the injured area.  Put ice in a plastic bag.  Place a towel between your skin and the bag.  Leave the ice on for 15-20 minutes, 03-04 times a day.  You may wear a rib belt as  directed by your caregiver to reduce pain.  Practice deep breathing as directed by your caregiver to keep your lungs clear.  Only take over-the-counter or prescription medicines for pain, fever, or discomfort as directed by your caregiver. SEEK IMMEDIATE MEDICAL CARE IF:   You have increasing pain or shortness of breath.  You cough up blood.  You have nausea, vomiting, or abdominal pain.  You have a fever.  You feel dizzy, weak, or you faint. MAKE SURE YOU:  Understand these instructions.  Will watch your condition.  Will get help right away if you are not doing well or get worse.   This information is not intended to replace advice given to you by your health care provider. Make sure you discuss any questions you have with your health care provider.   Document Released: 08/02/2004 Document Revised: 07/16/2014 Document Reviewed: 12/22/2014 Elsevier Interactive Patient Education 2016 Else[employer]  Cryotherapy Cryotherapy is when you put ice on your injury. Ice helps lessen pain and puffiness (swelling) after an injury. Ice works the best when you start using it in the first 24 to 48 hours after an injury. HOME CARE  Put a dry or damp towel between the ice pack and your skin.  You may press gently on the ice pack.  Leave the ice on for no more than 10 to 20 minutes at a time.  Check your skin after 5 minutes to make sure your skin is okay.  Rest at least 20 minutes between ice pack uses.  Stop using ice when your  skin loses feeling (numbness).  Do not use ice on someone who cannot tell you when it hurts. This includes small children and people with memory problems (dementia). GET HELP RIGHT AWAY IF:  You have white spots on your skin.  Your skin turns blue or pale.  Your skin feels waxy or hard.  Your puffiness gets worse. MAKE SURE YOU:   Understand these instructions.  Will watch your condition.  Will get help right away if you are not doing well or get  worse.   This information is not intended to replace advice given to you by your health care provider. Make sure you discuss any questions you have with your health care provider.   Document Released: 12/12/2007 Document Revised: 09/17/2011 Document Reviewed: 02/15/2011 Elsevier Interactive Patient Education 2016 [place].  Rib Fracture A rib fracture is a break or crack in one of the bones of the ribs. The ribs are a group of long, curved bones that wrap around your chest and attach to your spine. They protect your lungs and other organs in the chest cavity. A broken or cracked rib is often painful, but most do not cause other problems. Most rib fractures heal on their own over time. However, rib fractures can be more serious if multiple ribs are broken or if broken ribs move out of place and push against other structures. CAUSES   A direct blow to the chest. For example, this could happen during contact sports, a car accident, or a fall against a hard object.  Repetitive movements with high force, such as pitching a baseball or having severe coughing spells. SYMPTOMS   Pain when you breathe in or cough.  Pain when someone presses on the injured area. DIAGNOSIS  Your caregiver will perform a physical exam. Various imaging tests may be ordered to confirm the diagnosis and to look for related injuries. These tests may include a chest X-ray, computed tomography (CT), magnetic resonance imaging (MRI), or a bone scan. TREATMENT  Rib fractures usually heal on their own in 1-3 months. The longer healing period is often associated with a continued cough or other aggravating activities. During the healing period, pain control is very important. Medication is usually given to control pain. Hospitalization or surgery may be needed for more severe injuries, such as those in which multiple ribs are broken or the ribs have moved out of place.  HOME CARE INSTRUCTIONS   Avoid strenuous activity and  any activities or movements that cause pain. Be careful during activities and avoid bumping the injured rib.  Gradually increase activity as directed by your caregiver.  Only take over-the-counter or prescription medications as directed by your caregiver. Do not take other medications without asking your caregiver first.  Apply ice to the injured area for the first 1-2 days after you have been treated or as directed by your caregiver. Applying ice helps to reduce inflammation and pain.  Put ice in a plastic bag.  Place a towel between your skin and the bag.   Leave the ice on for 15-20 minutes at a time, every 2 hours while you are awake.  Perform deep breathing as directed by your caregiver. This will help prevent pneumonia, which is a common complication of a broken rib. Your caregiver may instruct you to:  Take deep breaths several times a day.  Try to cough several times a day, holding a pillow against the injured area.  Use a device called an incentive spirometer to practice deep  breathing several times a day.  Drink enough fluids to keep your urine clear or pale yellow. This will help you avoid constipation.   Do not wear a rib belt or binder. These restrict breathing, which can lead to pneumonia.  SEEK IMMEDIATE MEDICAL CARE IF:   You have a fever.   You have difficulty breathing or shortness of breath.   You develop a continual cough, or you cough up thick or bloody sputum.  You feel sick to your stomach (nausea), throw up (vomit), or have abdominal pain.   You have worsening pain not controlled with medications.  MAKE SURE YOU:  Understand these instructions.  Will watch your condition.  Will get help right away if you are not doing well or get worse.   This information is not intended to replace advice given to you by your health care provider. Make sure you discuss any questions you have with your health care provider.   Document Released: 06/25/2005  Document Revised: 02/25/2013 Document Reviewed: 08/27/2012 Elsevier Interactive Patient Education [employer].

## 2015-08-30 ENCOUNTER — Other Ambulatory Visit: Payer: Self-pay | Admitting: *Deleted

## 2015-08-30 ENCOUNTER — Encounter: Payer: Self-pay | Admitting: *Deleted

## 2015-08-30 DIAGNOSIS — C50912 Malignant neoplasm of unspecified site of left female breast: Secondary | ICD-10-CM

## 2015-08-31 ENCOUNTER — Encounter: Payer: Self-pay | Admitting: *Deleted

## 2015-08-31 ENCOUNTER — Ambulatory Visit: Payer: Medicaid Other | Admitting: Internal Medicine

## 2015-08-31 ENCOUNTER — Inpatient Hospital Stay: Payer: Self-pay

## 2015-08-31 ENCOUNTER — Inpatient Hospital Stay: Payer: Self-pay | Admitting: Internal Medicine

## 2015-08-31 NOTE — Progress Notes (Signed)
F/u breast cancer. No show on 08/31/15 for oncology apt today with Dr. Rogue Bussing. Msg sent to cancer ct scheduling team to r/s this apt.

## 2015-09-14 ENCOUNTER — Telehealth: Payer: Self-pay | Admitting: *Deleted

## 2015-09-14 ENCOUNTER — Inpatient Hospital Stay: Payer: Medicaid Other

## 2015-09-14 ENCOUNTER — Inpatient Hospital Stay: Payer: Medicaid Other | Attending: Internal Medicine | Admitting: Internal Medicine

## 2015-09-14 VITALS — BP 139/74 | HR 80 | Temp 98.1°F | Ht 62.0 in | Wt 159.8 lb

## 2015-09-14 DIAGNOSIS — Z800 Family history of malignant neoplasm of digestive organs: Secondary | ICD-10-CM

## 2015-09-14 DIAGNOSIS — R011 Cardiac murmur, unspecified: Secondary | ICD-10-CM | POA: Insufficient documentation

## 2015-09-14 DIAGNOSIS — G629 Polyneuropathy, unspecified: Secondary | ICD-10-CM | POA: Diagnosis not present

## 2015-09-14 DIAGNOSIS — Z1239 Encounter for other screening for malignant neoplasm of breast: Secondary | ICD-10-CM

## 2015-09-14 DIAGNOSIS — Z9012 Acquired absence of left breast and nipple: Secondary | ICD-10-CM | POA: Diagnosis not present

## 2015-09-14 DIAGNOSIS — C50912 Malignant neoplasm of unspecified site of left female breast: Secondary | ICD-10-CM

## 2015-09-14 DIAGNOSIS — R232 Flushing: Secondary | ICD-10-CM | POA: Insufficient documentation

## 2015-09-14 DIAGNOSIS — R071 Chest pain on breathing: Secondary | ICD-10-CM | POA: Diagnosis not present

## 2015-09-14 DIAGNOSIS — Z8781 Personal history of (healed) traumatic fracture: Secondary | ICD-10-CM | POA: Insufficient documentation

## 2015-09-14 DIAGNOSIS — Z9181 History of falling: Secondary | ICD-10-CM | POA: Diagnosis not present

## 2015-09-14 DIAGNOSIS — R0781 Pleurodynia: Secondary | ICD-10-CM | POA: Diagnosis not present

## 2015-09-14 DIAGNOSIS — Z801 Family history of malignant neoplasm of trachea, bronchus and lung: Secondary | ICD-10-CM | POA: Diagnosis not present

## 2015-09-14 DIAGNOSIS — Z853 Personal history of malignant neoplasm of breast: Secondary | ICD-10-CM | POA: Diagnosis not present

## 2015-09-14 DIAGNOSIS — J45909 Unspecified asthma, uncomplicated: Secondary | ICD-10-CM | POA: Insufficient documentation

## 2015-09-14 DIAGNOSIS — Z79899 Other long term (current) drug therapy: Secondary | ICD-10-CM | POA: Diagnosis not present

## 2015-09-14 DIAGNOSIS — K219 Gastro-esophageal reflux disease without esophagitis: Secondary | ICD-10-CM | POA: Insufficient documentation

## 2015-09-14 LAB — CBC WITH DIFFERENTIAL/PLATELET
BASOS ABS: 0 10*3/uL (ref 0–0.1)
Basophils Relative: 1 %
EOS PCT: 2 %
Eosinophils Absolute: 0.1 10*3/uL (ref 0–0.7)
HEMATOCRIT: 35.3 % (ref 35.0–47.0)
Hemoglobin: 12 g/dL (ref 12.0–16.0)
LYMPHS PCT: 44 %
Lymphs Abs: 2.7 10*3/uL (ref 1.0–3.6)
MCH: 28.7 pg (ref 26.0–34.0)
MCHC: 33.9 g/dL (ref 32.0–36.0)
MCV: 84.8 fL (ref 80.0–100.0)
Monocytes Absolute: 0.5 10*3/uL (ref 0.2–0.9)
Monocytes Relative: 8 %
NEUTROS ABS: 2.8 10*3/uL (ref 1.4–6.5)
Neutrophils Relative %: 45 %
PLATELETS: 246 10*3/uL (ref 150–440)
RBC: 4.17 MIL/uL (ref 3.80–5.20)
RDW: 15.8 % — ABNORMAL HIGH (ref 11.5–14.5)
WBC: 6.2 10*3/uL (ref 3.6–11.0)

## 2015-09-14 LAB — COMPREHENSIVE METABOLIC PANEL
ALK PHOS: 89 U/L (ref 38–126)
ALT: 36 U/L (ref 14–54)
ANION GAP: 8 (ref 5–15)
AST: 46 U/L — ABNORMAL HIGH (ref 15–41)
Albumin: 4.2 g/dL (ref 3.5–5.0)
BILIRUBIN TOTAL: 0.5 mg/dL (ref 0.3–1.2)
BUN: 9 mg/dL (ref 6–20)
CALCIUM: 9.1 mg/dL (ref 8.9–10.3)
CO2: 21 mmol/L — ABNORMAL LOW (ref 22–32)
Chloride: 106 mmol/L (ref 101–111)
Creatinine, Ser: 0.61 mg/dL (ref 0.44–1.00)
GFR calc non Af Amer: >60 mL/min (ref >60)
Glucose, Bld: 123 mg/dL — ABNORMAL HIGH (ref 65–99)
POTASSIUM: 4.1 mmol/L (ref 3.5–5.1)
SODIUM: 135 mmol/L (ref 135–145)
TOTAL PROTEIN: 7.7 g/dL (ref 6.5–8.1)

## 2015-09-14 MED ORDER — ANASTROZOLE 1 MG PO TABS: 1.0000 mg | ORAL_TABLET | Freq: Every day | ORAL | Status: DC

## 2015-09-14 MED ORDER — GABAPENTIN 300 MG PO CAPS: 300.0000 mg | ORAL_CAPSULE | Freq: Three times a day (TID) | ORAL | Status: AC

## 2015-09-14 NOTE — Progress Notes (Signed)
[facility] OFFICE PROGRESS NOTE  Patient Care Team: No Pcp Per Patient as PCP - General (General Practice)   SUMMARY OF ONCOLOGIC HISTORY:  # MARCH 2016- STAGE I BREAST CA LEFT [multfocal; largest 30m]- ER/PR-pos; her 2 NEG; s/p Mastec & SLNBx;[Oncotype- 4; low risk] Arimidex   # Hot flashes  # Left mastectomy neuropathic pain-   INTERVAL HISTORY:  This is my first interaction with the patient since I joined the practice September 2016. I reviewed the patient's prior charts/pertinent labs/imaging in detail; findings are summarized above.    55year old female patient with above history of stage I breast cancer ER/PR positive HER-2/neu negative currently on adjuvant aromatase inhibitor is here for follow-up.   Patient states that she had not been taking her Arimidex  For the last few months-  Because of  Monetary issues.  She completes of hot flashes.   She complains of  Pain in the left mastectomy site since the surgery.  She denies any lumps or bumps.  In general appetite is okay. Not losing any weight. No cough or shortness of breath or chest pain.   However she does tell me that she fell recently/ because her left knee gave out-  She is noted to have  Rib fractures nondisplaced on the CT scan.  Otherwise the scan was unremarkable.   REVIEW OF SYSTEMS:  A complete 10 point review of system is done which is negative except mentioned above/history of present illness.   PAST MEDICAL HISTORY :  Past Medical History  Diagnosis Date  . [place]  . Asthma     PAST SURGICAL HISTORY :   Past Surgical History  Procedure Laterality Date  . Mastectomy    . Abdominal hysterectomy    . Sentinel node biopsy      FAMILY HISTORY :   Family History  Problem Relation Age of Onset  . Lung cancer Father   . Throat cancer Cousin     SOCIAL HISTORY:   Social History  Substance Use Topics  . Smoking status: Never Smoker   .  Smokeless tobacco: Not on file  . Alcohol Use: Yes    ALLERGIES:  is allergic to penicillins.  MEDICATIONS:  Current Outpatient Prescriptions  Medication Sig Dispense Refill  . albuterol (PROAIR HFA) 108 (90 Base) MCG/ACT inhaler Inhale into the lungs.    .Marland Kitchenanastrozole (ARIMIDEX) 1 MG tablet Take by mouth.    . calcium-vitamin D (OSCAL) 250-125 MG-UNIT per tablet Take 1 tablet by mouth daily. 30 tablet 0  . esomeprazole (NEXIUM) 40 MG capsule Take by mouth.    .Marland Kitchenibuprofen (ADVIL,MOTRIN) 600 MG tablet Take 1 tablet (600 mg total) by mouth every 8 (eight) hours as needed. 15 tablet 0  . ketorolac (TORADOL) 10 MG tablet Take by mouth.    . oxyCODONE-acetaminophen (PERCOCET/ROXICET) 5-325 MG tablet Take by mouth.    . traMADol (ULTRAM) 50 MG tablet Take by mouth.     No current facility-administered medications for this visit.    PHYSICAL EXAMINATION: ECOG PERFORMANCE STATUS: 0 - Asymptomatic  BP 139/74 mmHg  Pulse 80  Temp(Src) 98.1 F (36.7 C) (Oral)  Ht 5' 2"  (1.575 m)  Wt 159 lb 13.3 oz (72.5 kg)  BMI 29.23 kg/m2  Filed Weights   09/14/15 1150  Weight: 159 lb 13.3 oz (72.5 kg)    GENERAL: Well-nourished well-developed; Alert, no distress and comfortable.   Alone. EYES: no  pallor or icterus OROPHARYNX: no thrush or ulceration; good dentition  NECK: supple, no masses felt LYMPH:  no palpable lymphadenopathy in the cervical, axillary or inguinal regions LUNGS: clear to auscultation and  No wheeze or crackles HEART/CVS: regular rate & rhythm and no murmurs; No lower extremity edema ABDOMEN:abdomen soft, non-tender and normal bowel sounds Musculoskeletal:no cyanosis of digits and no clubbing  PSYCH: alert & oriented x 3 with fluent speech NEURO: no focal motor/sensory deficits SKIN:  no rashes or significant lesions  Right and left BREAST exam [in the presence of nurse]-LEFT mastectomy- noted; No skin changes. ? tenderness Right breast- no unusual skin changes or dominant  masses felt. Surgical scars noted.    LABORATORY DATA:  I have reviewed the data as listed    Component Value Date/Time   NA 135 09/14/2015 1048   NA 142 10/27/2014 2153   K 4.1 09/14/2015 1048   K 4.0 10/27/2014 2153   CL 106 09/14/2015 1048   CL 106 10/27/2014 2153   CO2 21* 09/14/2015 1048   CO2 23 10/27/2014 2153   GLUCOSE 123* 09/14/2015 1048   GLUCOSE 116* 10/27/2014 2153   BUN 9 09/14/2015 1048   BUN 7 10/27/2014 2153   CREATININE 0.61 09/14/2015 1048   CREATININE 0.63 10/27/2014 2153   CALCIUM 9.1 09/14/2015 1048   CALCIUM 9.7 10/27/2014 2153   PROT 7.7 09/14/2015 1048   PROT 8.2* 10/27/2014 2153   ALBUMIN 4.2 09/14/2015 1048   ALBUMIN 4.5 10/27/2014 2153   AST 46* 09/14/2015 1048   AST 58* 10/27/2014 2153   ALT 36 09/14/2015 1048   ALT 29 10/27/2014 2153   ALKPHOS 89 09/14/2015 1048   ALKPHOS 94 10/27/2014 2153   BILITOT 0.5 09/14/2015 1048   BILITOT 0.6 10/27/2014 2153   GFRNONAA >60 09/14/2015 1048   GFRNONAA >60 10/27/2014 2153   GFRNONAA >60 07/28/2014 1715   GFRAA >60 09/14/2015 1048   GFRAA >60 10/27/2014 2153   GFRAA >60 07/28/2014 1715    No results found for: SPEP, UPEP  Lab Results  Component Value Date   WBC 6.2 09/14/2015   NEUTROABS 2.8 09/14/2015   HGB 12.0 09/14/2015   HCT 35.3 09/14/2015   MCV 84.8 09/14/2015   PLT 246 09/14/2015      Chemistry      Component Value Date/Time   NA 135 09/14/2015 1048   NA 142 10/27/2014 2153   K 4.1 09/14/2015 1048   K 4.0 10/27/2014 2153   CL 106 09/14/2015 1048   CL 106 10/27/2014 2153   CO2 21* 09/14/2015 1048   CO2 23 10/27/2014 2153   BUN 9 09/14/2015 1048   BUN 7 10/27/2014 2153   CREATININE 0.61 09/14/2015 1048   CREATININE 0.63 10/27/2014 2153      Component Value Date/Time   CALCIUM 9.1 09/14/2015 1048   CALCIUM 9.7 10/27/2014 2153   ALKPHOS 89 09/14/2015 1048   ALKPHOS 94 10/27/2014 2153   AST 46* 09/14/2015 1048   AST 58* 10/27/2014 2153   ALT 36 09/14/2015 1048   ALT 29  10/27/2014 2153   BILITOT 0.5 09/14/2015 1048   BILITOT 0.6 10/27/2014 2153         ASSESSMENT & PLAN:   #  Stage I breast cancer ER/PR positive HER-2 negative; low risk Oncotype- 4.  Currently on adjuvant aromatase inhibitor.  Clinically no evidence of recurrence.  Patient unfortunately not on aromatase inhibitor-  Secondary to  Monetary issues.  We'll contact social work.  New prescription for Arimidex given.  #  Neuropathic pain status post left mastectomy-  Recommend  Neurontin 100 mg 3 times a day;  And titrate up as needed.  Discussed the potential side effects  #  Hot flashes likely from-  Postmenopausal status;   Neurontin might also help.  #  We will get a screening mammogram on the right side.  Breast exam  Within normal limits.  #  Left rib pain status post  Fall-  Nondisplaced fractures noted on the CAT scan.   #  Patient follow-up with [place] in approximately 3 months.  No labs needed.  # 15 minutes face-to-face with the patient discussing the above plan of care; more than 50% of time spent on natural history; counseling and coordination.      Cammie Sickle, MD 09/14/2015 12:24 PM

## 2015-09-14 NOTE — Telephone Encounter (Signed)
Pt contacted and while she was in office today pt expressed that she could not afford medication for breast cancer. She has medicaid but can't pay copay.  Spoke with Barnabas Lister and he said yes to copay for anastrozole. Dr. B ordered gabapentin for her nerve pain and he will pay for copay. Barnabas Lister to call [facility] and let him know to cover copay for both meds.  I told pt that i have sent the prescriptions to pharmacy and they will pay for copay for both of them. Also she asked for rx for bra and prosthesis and I have put that in the mail for her and she knows that she can go to [facility] and she had spoke to them in the past but had to have rx.

## 2015-09-22 ENCOUNTER — Ambulatory Visit
Admission: RE | Admit: 2015-09-22 | Discharge: 2015-09-22 | Disposition: A | Discharge status: Home / Self Care | Payer: Medicaid Other | Source: Ambulatory Visit | Attending: Internal Medicine | Admitting: Internal Medicine

## 2015-09-22 DIAGNOSIS — Z1231 Encounter for screening mammogram for malignant neoplasm of breast: Secondary | ICD-10-CM | POA: Diagnosis present

## 2015-09-22 DIAGNOSIS — C50912 Malignant neoplasm of unspecified site of left female breast: Secondary | ICD-10-CM

## 2015-09-22 DIAGNOSIS — Z1239 Encounter for other screening for malignant neoplasm of breast: Secondary | ICD-10-CM

## 2015-09-22 HISTORY — DX: Malignant neoplasm of unspecified site of unspecified female breast: C50.919

## 2015-12-15 ENCOUNTER — Inpatient Hospital Stay: Payer: Medicaid Other | Admitting: Internal Medicine

## 2015-12-29 ENCOUNTER — Encounter (INDEPENDENT_AMBULATORY_CARE_PROVIDER_SITE_OTHER): Payer: Self-pay

## 2015-12-29 ENCOUNTER — Inpatient Hospital Stay: Payer: Medicaid Other | Attending: Internal Medicine | Admitting: Internal Medicine

## 2015-12-29 VITALS — BP 139/74 | HR 67 | Temp 96.5°F | Resp 18 | Wt 149.0 lb

## 2015-12-29 DIAGNOSIS — Z8781 Personal history of (healed) traumatic fracture: Secondary | ICD-10-CM | POA: Diagnosis not present

## 2015-12-29 DIAGNOSIS — Z79811 Long term (current) use of aromatase inhibitors: Secondary | ICD-10-CM | POA: Diagnosis not present

## 2015-12-29 DIAGNOSIS — C50812 Malignant neoplasm of overlapping sites of left female breast: Secondary | ICD-10-CM | POA: Insufficient documentation

## 2015-12-29 DIAGNOSIS — Z79899 Other long term (current) drug therapy: Secondary | ICD-10-CM | POA: Diagnosis not present

## 2015-12-29 DIAGNOSIS — J45909 Unspecified asthma, uncomplicated: Secondary | ICD-10-CM | POA: Insufficient documentation

## 2015-12-29 DIAGNOSIS — Z9011 Acquired absence of right breast and nipple: Secondary | ICD-10-CM | POA: Diagnosis not present

## 2015-12-29 DIAGNOSIS — Z170 Estrogen receptor positive status [ER+]: Secondary | ICD-10-CM | POA: Insufficient documentation

## 2015-12-29 DIAGNOSIS — Z808 Family history of malignant neoplasm of other organs or systems: Secondary | ICD-10-CM | POA: Diagnosis not present

## 2015-12-29 DIAGNOSIS — Z801 Family history of malignant neoplasm of trachea, bronchus and lung: Secondary | ICD-10-CM | POA: Diagnosis not present

## 2015-12-29 DIAGNOSIS — N951 Menopausal and female climacteric states: Secondary | ICD-10-CM | POA: Diagnosis not present
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
## 2015-12-29 NOTE — Assessment & Plan Note (Addendum)
#  Stage I breast cancer ER/PR positive HER-2 negative; low risk Oncotype- 4.  Currently on adjuvant aromatase inhibitor.  Clinically no evidence of recurrence.  Right breast mammogram March 2016- normal  #  Hot flashes likely from-  Postmenopausal status;     #  Patient follow-up with [place] in approximately 6 months; CBC CMP.

## 2015-12-29 NOTE — Progress Notes (Deleted)
RN Chaperoned provider with Breast Exam.   
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
## 2016-01-13 ENCOUNTER — Encounter: Payer: Self-pay | Admitting: Internal Medicine

## 2016-05-31 IMAGING — CR DG CHEST 1V PORT
1 series · 1 of 1 positions shown · non-contrast
Comparison: June 02, 2013.

CLINICAL DATA: Abdominal pain.

EXAM:
PORTABLE CHEST - 1 VIEW

[ap]
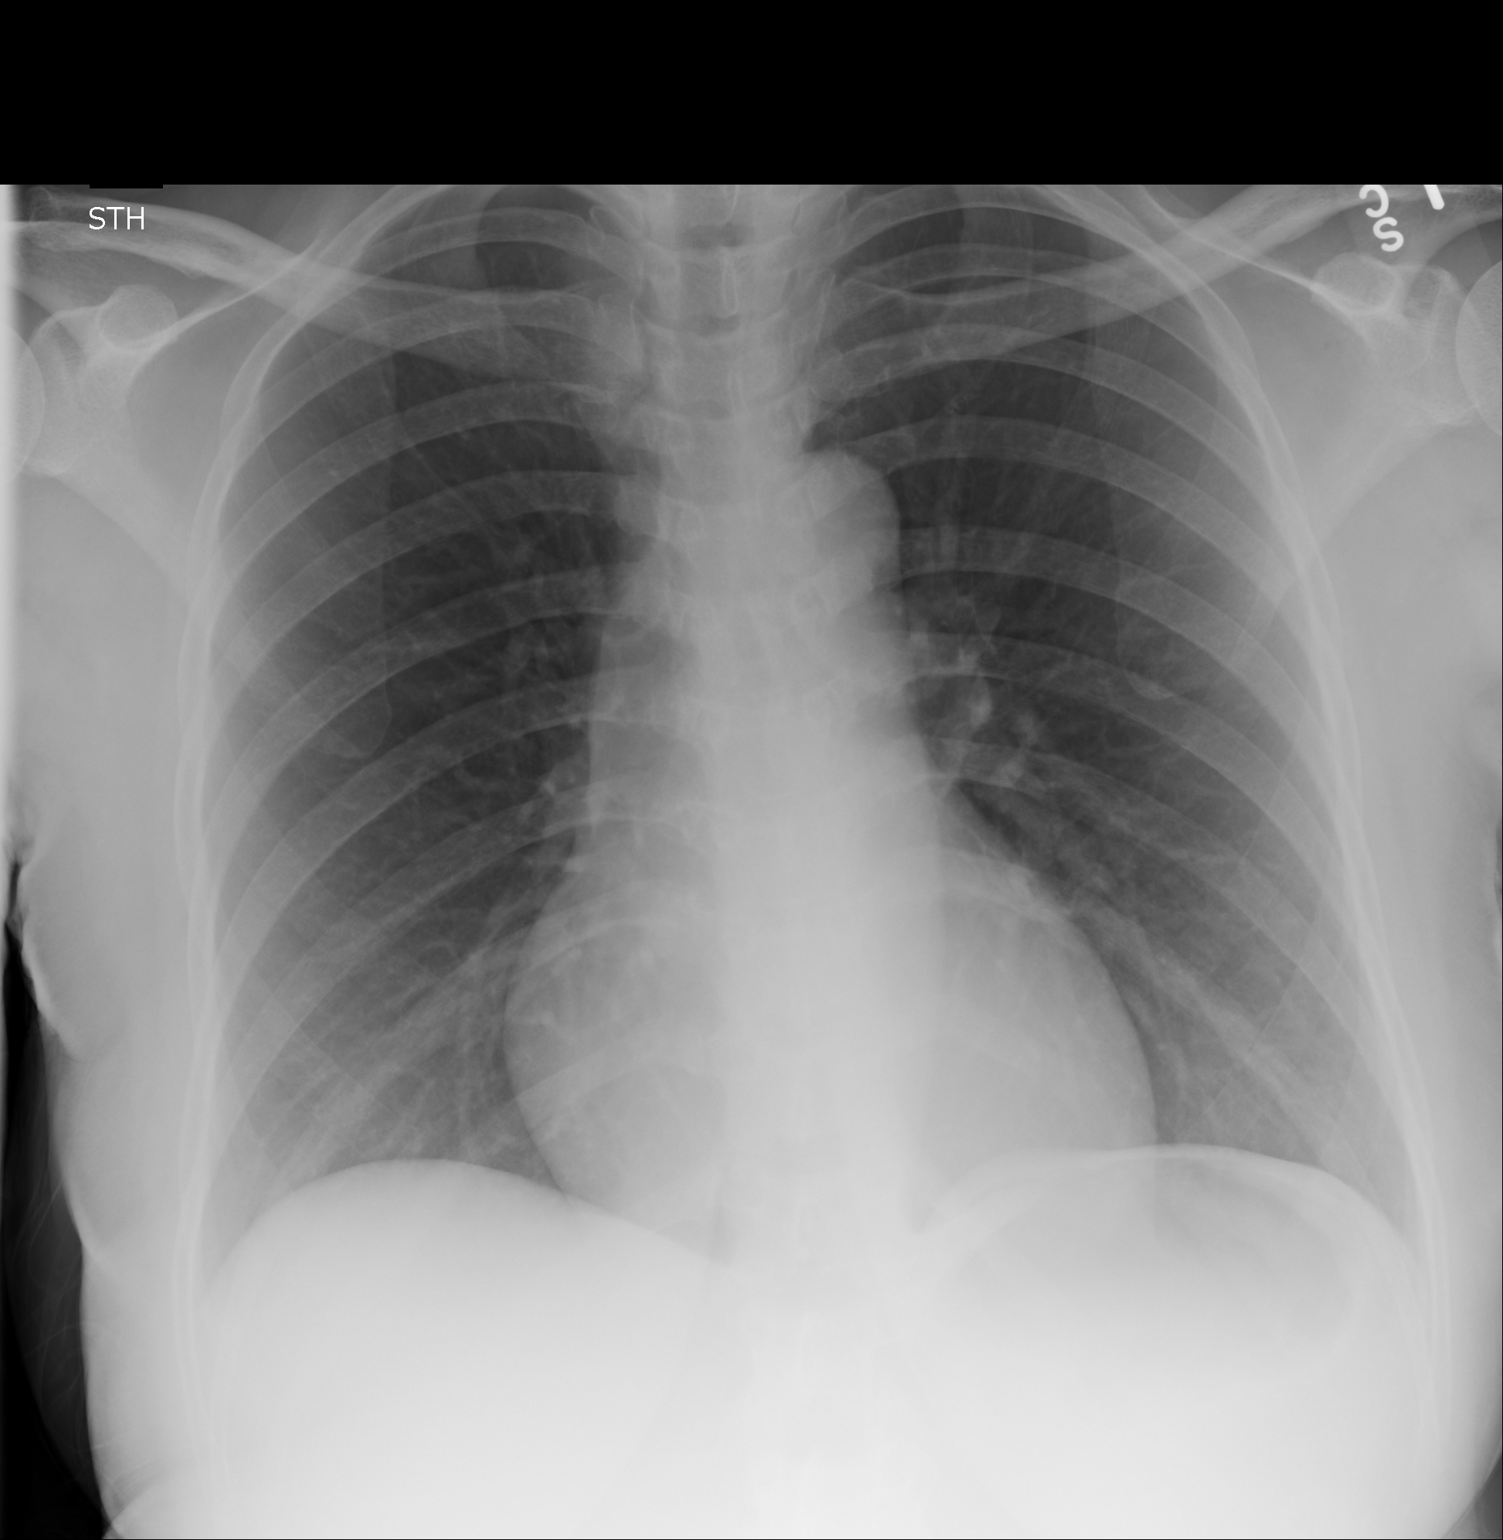

[1 of 1 positions shown; findings below may reference images not displayed]

FINDINGS: The heart size and mediastinal contours are within normal limits.
Both lungs are clear. No pneumothorax or pleural effusion is noted.
The visualized skeletal structures are unremarkable.
IMPRESSION: No acute cardiopulmonary abnormality seen.

## 2016-06-29 ENCOUNTER — Ambulatory Visit: Payer: Medicaid Other | Admitting: Internal Medicine

## 2016-06-29 ENCOUNTER — Other Ambulatory Visit: Payer: Medicaid Other

## 2016-07-13 ENCOUNTER — Inpatient Hospital Stay: Payer: Medicaid Other

## 2016-07-13 ENCOUNTER — Inpatient Hospital Stay: Payer: Medicaid Other | Admitting: Internal Medicine

## 2016-07-16 ENCOUNTER — Encounter: Payer: Self-pay | Admitting: *Deleted

## 2016-07-16 NOTE — Progress Notes (Signed)
Sent recertification for [facility] Medicaid to DSS.

## 2016-07-25 ENCOUNTER — Inpatient Hospital Stay: Payer: Medicaid Other | Admitting: Internal Medicine

## 2016-07-25 ENCOUNTER — Inpatient Hospital Stay: Payer: Medicaid Other

## 2016-08-10 ENCOUNTER — Inpatient Hospital Stay: Payer: Medicaid Other

## 2016-08-10 ENCOUNTER — Inpatient Hospital Stay: Payer: Medicaid Other | Admitting: Internal Medicine

## 2016-09-27 IMAGING — CT CT CHEST W/ CM
2 of 4 series · 15 of 36 positions shown, 18 images · IV contrast (omnipaque)
Comparison: Chest radiograph 03/31/2014

CLINICAL DATA: Evaluate for left chest wall mass. Physical exam
finding identified in the left breast 6 o'clock region.

EXAM:
CT CHEST WITH CONTRAST
TECHNIQUE: Multidetector CT imaging of the chest was performed during
intravenous contrast administration.
CONTRAST:  60 ml Omnipaque 350

[Series 2: routine chest with · axial · 0.68mm/px · z∈[+636,+866]mm · 12 of 56 slices shown, 15 images]
[im 5/56  mediastinal]
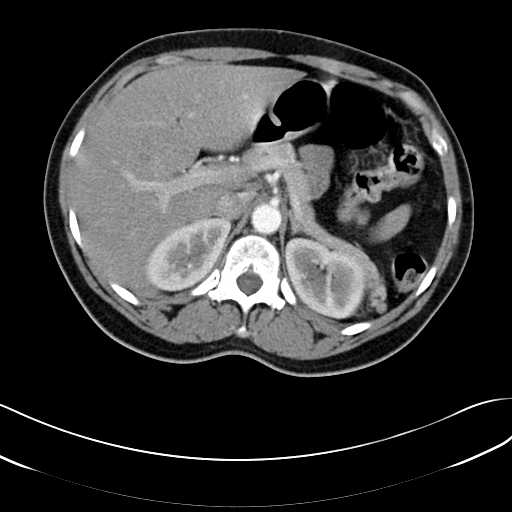
[im 5/56  lung]
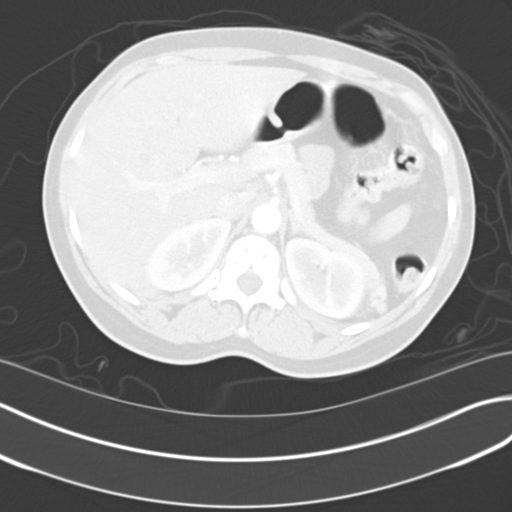
[im 9/56  lung]
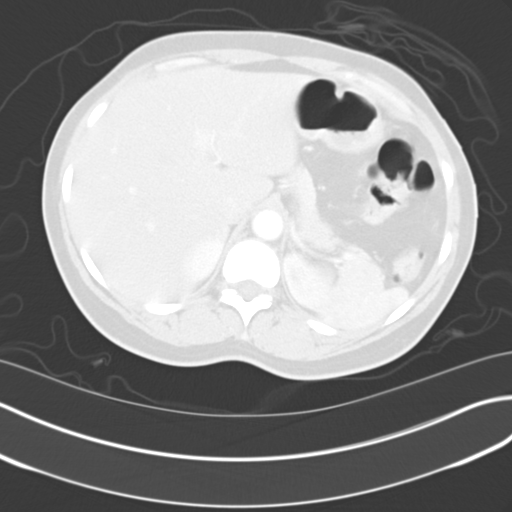
[im 13/56  lung]
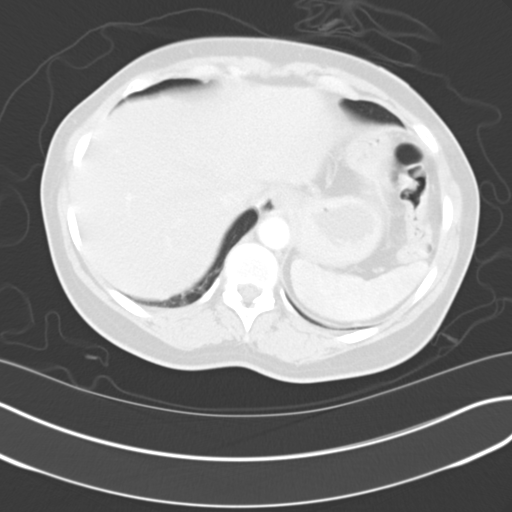
[im 17/56  lung]
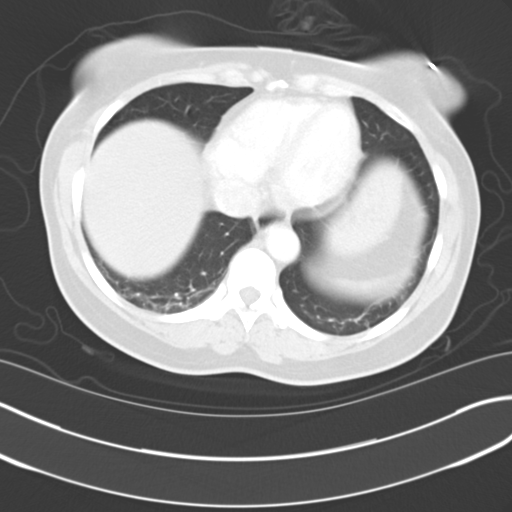
[im 22/56  mediastinal]
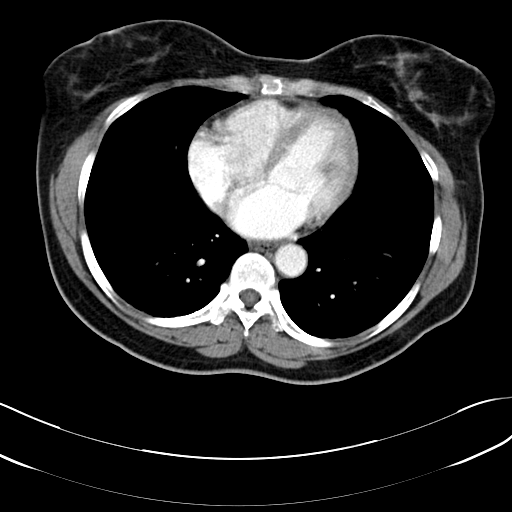
[im 22/56  lung]
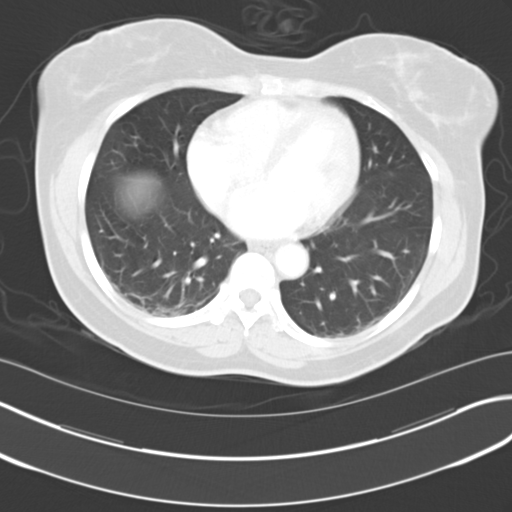
[im 26/56  lung]
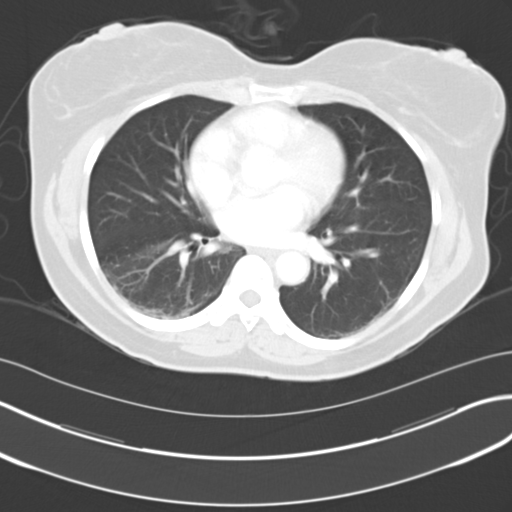
[im 30/56  lung]
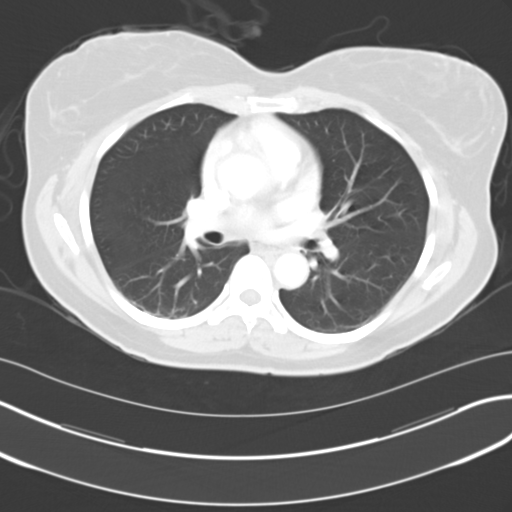
[im 34/56  lung]
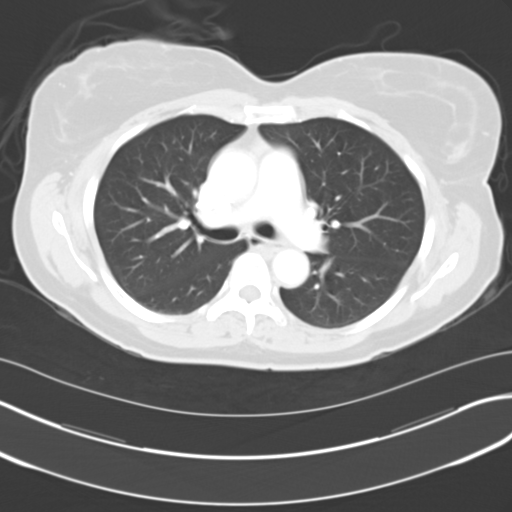
[im 39/56  mediastinal]
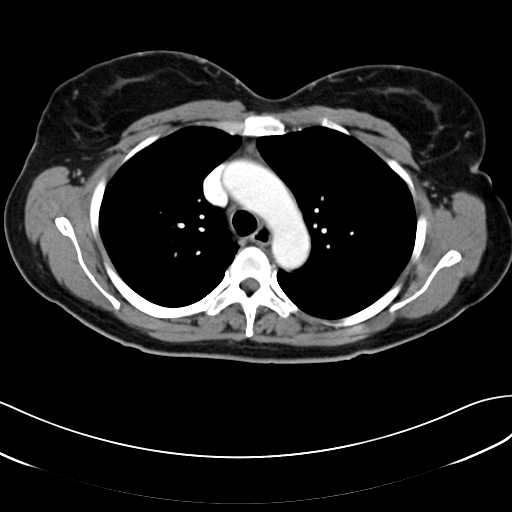
[im 39/56  lung]
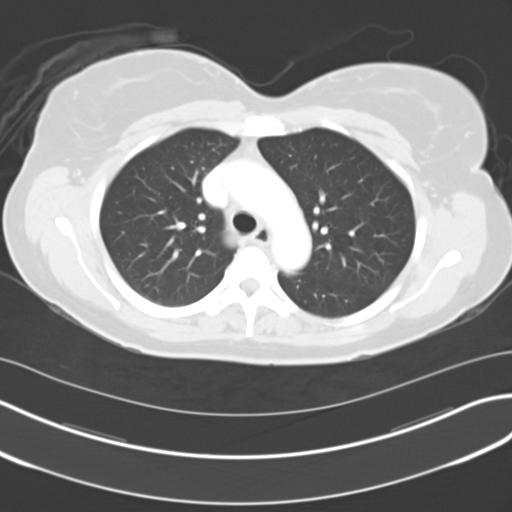
[im 43/56  lung]
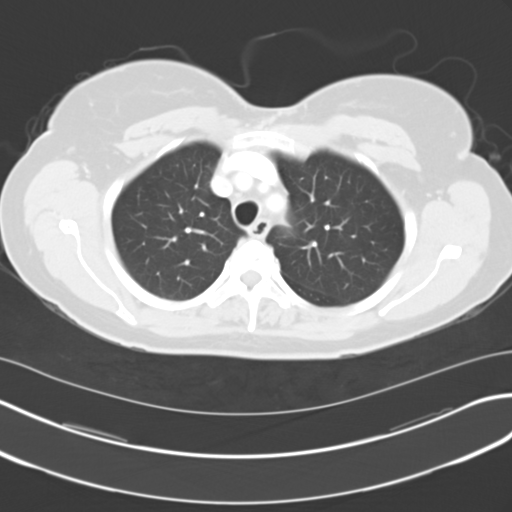
[im 47/56  lung]
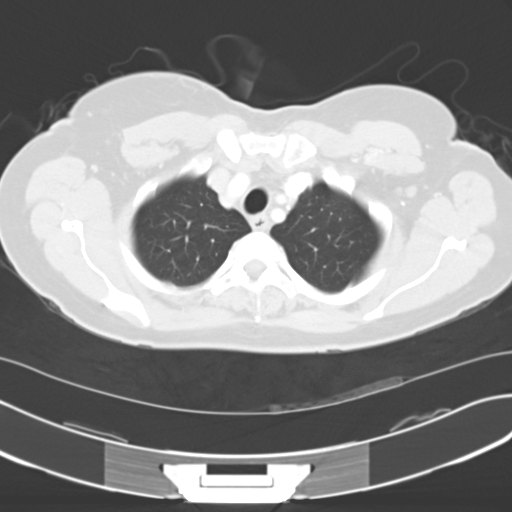
[im 51/56  lung]
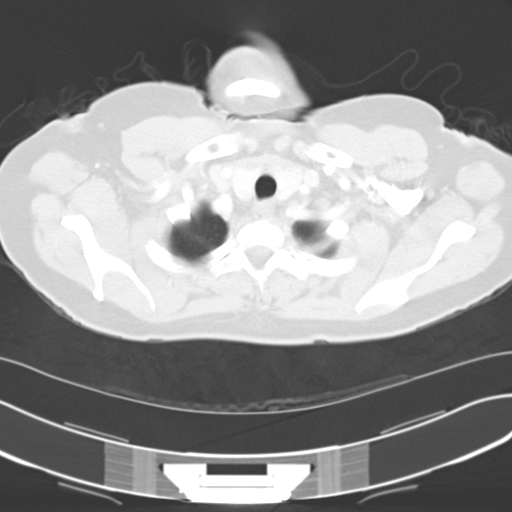

[Series 5: cor routine chest with · coronal · 0.68mm/px · 3 of 132 slices shown]
[im 27/132  lung]
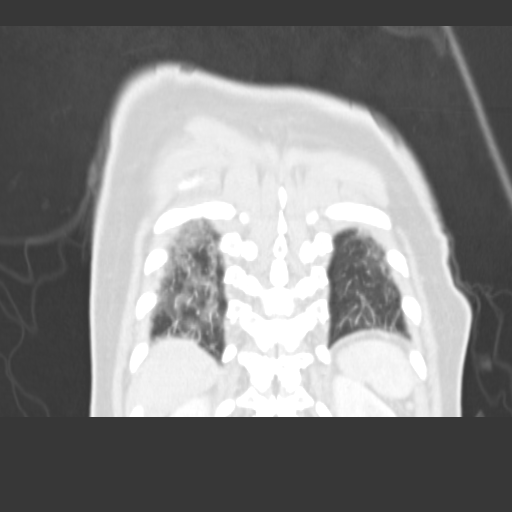
[im 53/132  lung]
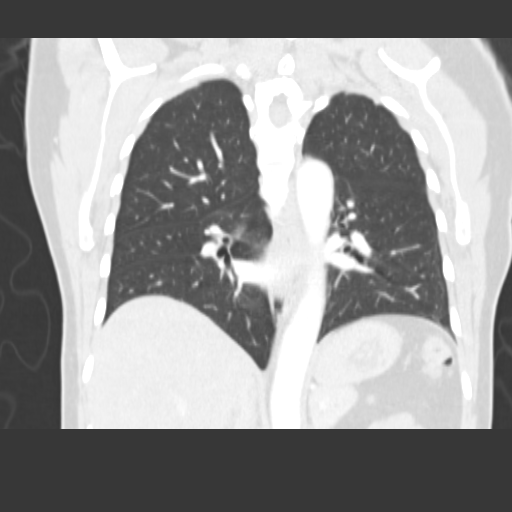
[im 79/132  lung]
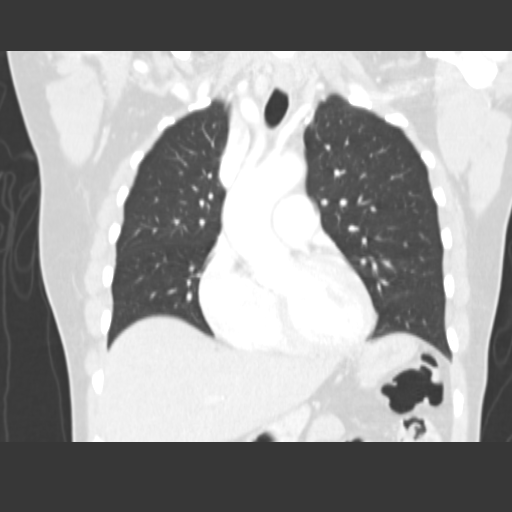

[15 of 36 positions shown; findings below may reference images not displayed]

FINDINGS: There is no evidence for chest lymphadenopathy. No significant
pericardial or pleural fluid. Round low-density structure in the
anterior liver measures 1 cm and likely represents a benign
etiology, such as a cyst. Question small cysts in the left kidney
upper pole.

The trachea and mainstem bronchi are patent. Lungs are clear. Mild
dependent atelectasis in the lower lobes.

There is no evidence for a chest wall mass. However, there is
concern for a lesion along the inferior left breast tissue on
sequence 2, image 37. This area roughly measures 2.1 x 2.4 cm. This
appears to correspond with the physical exam findings. There may be
another focus of breast thickening or nodularity on image number 25.

No acute bone abnormality.
IMPRESSION: There is a left breast lesion along the inferior aspect, measuring
up to 2.4 cm. Recommend further evaluation with mammography. No
evidence for a chest wall mass.

No acute cardiopulmonary findings.

These results were called by telephone at the time of interpretation
on 07/28/2014 at [DATE] to Dr. DEEQA RAYAAN ADLAHO , who verbally
acknowledged these results.

## 2016-10-26 IMAGING — US US BIOPSY BREAST CORE W/ IMAGING
1 series · 11 of 11 positions shown · non-contrast
Comparison: Previous exams.

ADDENDUM:
Final pathology demonstrates INVASIVE MAMMARY CARCINOMA.

Histology correlates with imaging findings.
These results were given to the patient by Jamileht Sibrian, RN.
Recommend surgery/oncology consultation.
Strongly consider breast MR if breast conservation is desired given
suspicious calcifications/densities extending over 6 cm within the
left breast..
CLINICAL DATA: 53-year-old female for tissue sampling of highly
suspicious 1 cm mass in the lower outer left breast.
EXAM:
ULTRASOUND GUIDED LEFT BREAST CORE NEEDLE BIOPSY WITH VACUUM ASSIST

[Series 1: us biopsy breast core w/ imaging · 0.08mm/px · 11 of 11 slices shown]
[im 1/11]
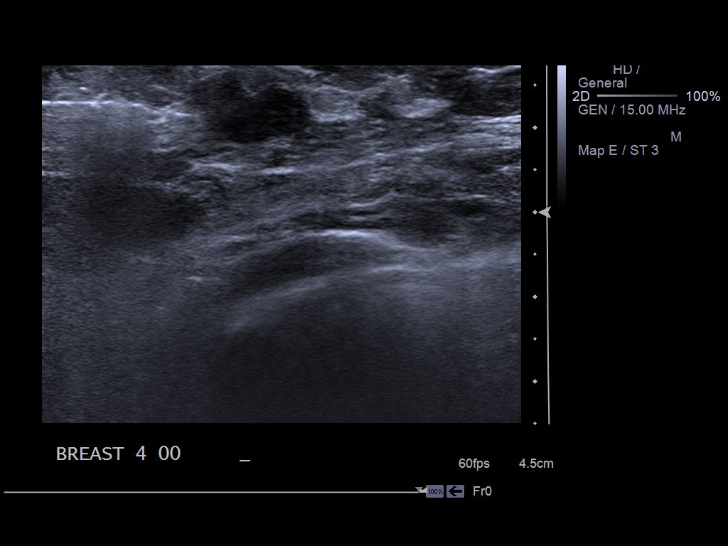
[im 2/11]
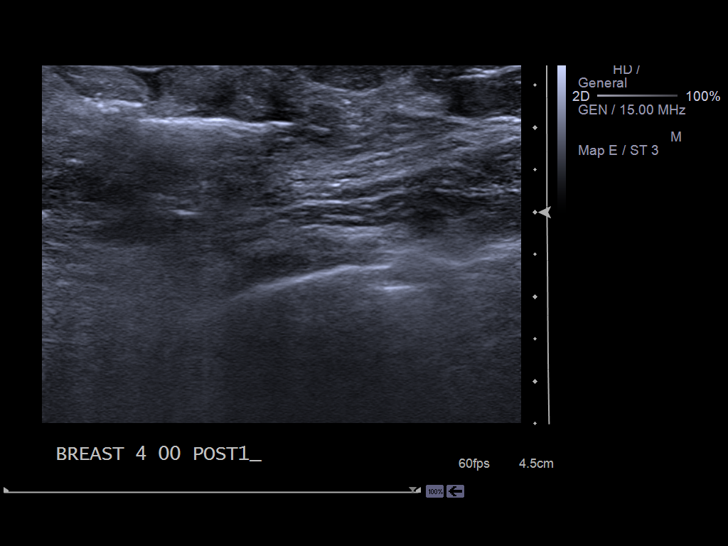
[im 3/11]
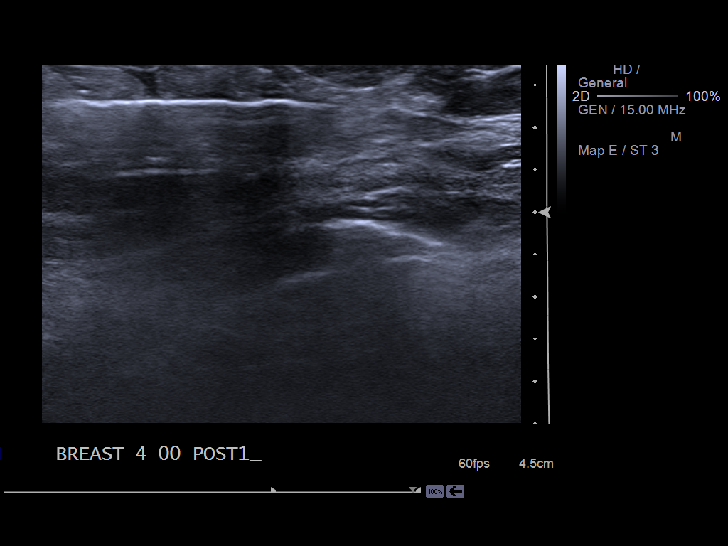
[im 4/11]
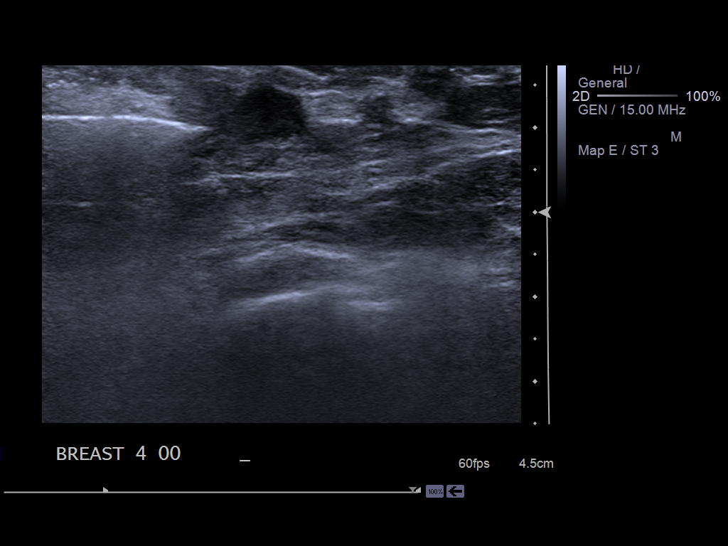
[im 5/11]
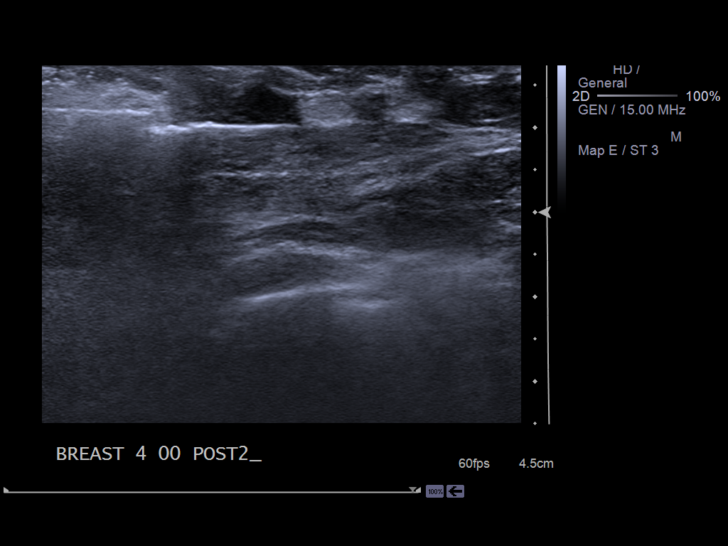
[im 6/11]
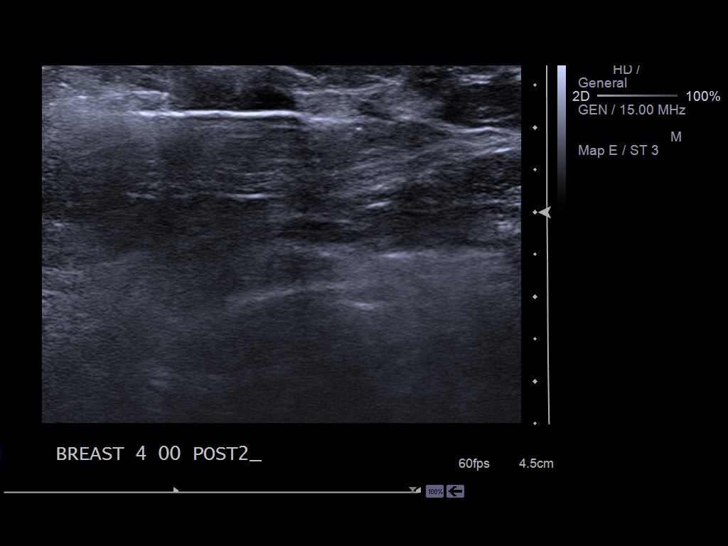
[im 7/11]
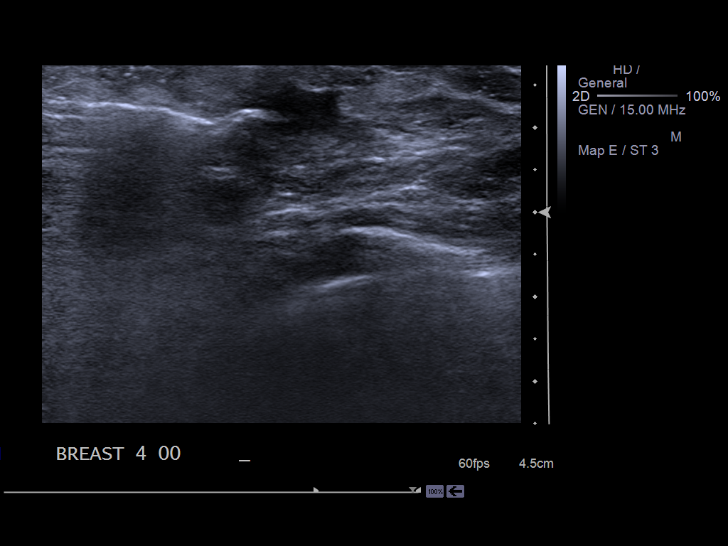
[im 8/11]
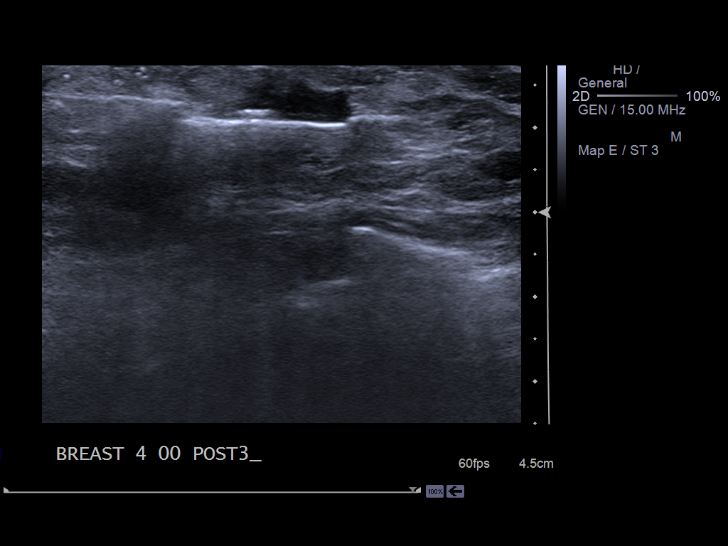
[im 9/11]
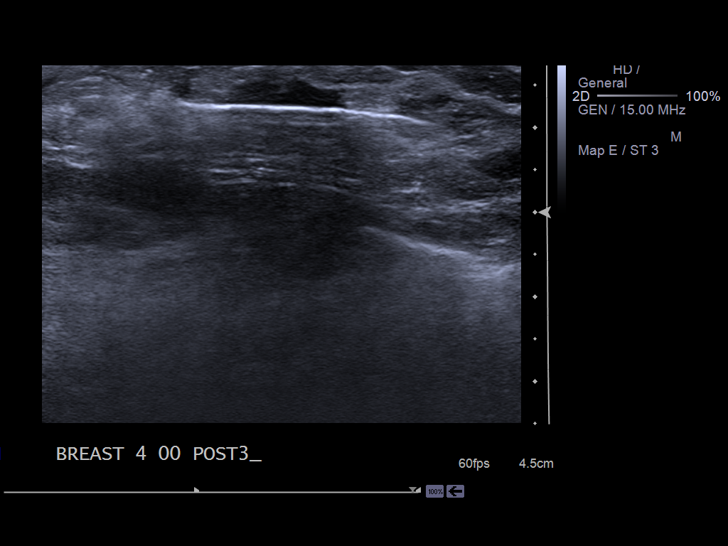
[im 10/11]
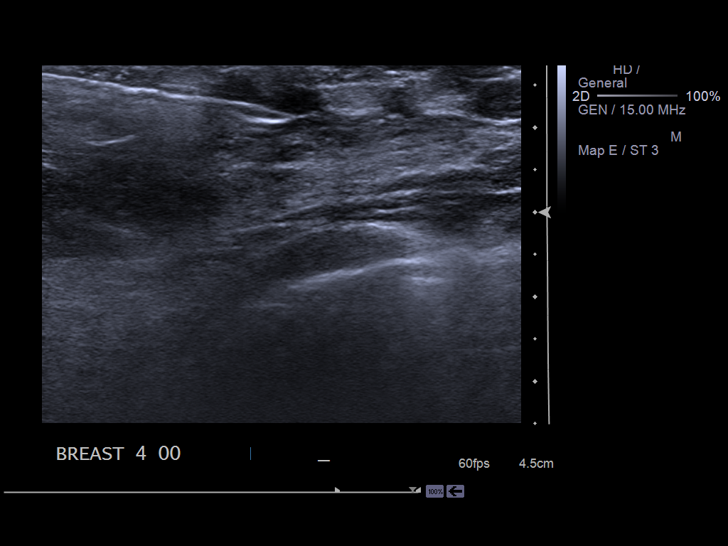
[im 11/11]
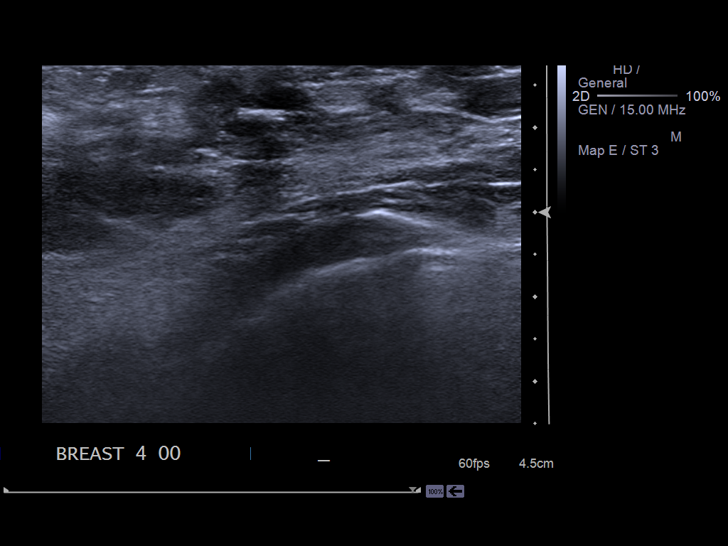

[11 of 11 positions shown; findings below may reference images not displayed]

PROCEDURE:
I met with the patient and we discussed the procedure of
ultrasound-guided biopsy, including benefits and alternatives. We
discussed the high likelihood of a successful procedure. We
discussed the risks of the procedure including infection, bleeding,
tissue injury, clip migration, and inadequate sampling. Informed
written consent was given. The usual time-out protocol was performed
immediately prior to the procedure.

Using sterile technique and 2% Lidocaine as local anesthetic, under
direct ultrasound visualization, a 12 gauge vacuum-assisteddevice
was used to perform biopsy of the suspicious 1 cm mass at the 4
o'clock position of the left breast using a medial approach. At the
conclusion of the procedure, a ribbon shaped tissue marker clip was
deployed into the biopsy cavity. Follow-up 2-view mammogram was
performed and dictated separately.
IMPRESSION: Ultrasound-guided biopsy of suspicious mass in the lower outer left
breast. No apparent complications.

Please note that suspicious calcifications/densities extend over a 6
cm span within the left breast and may need to be sampled if breast
conservation is desired.

Pathology will be followed.

## 2017-01-18 ENCOUNTER — Inpatient Hospital Stay: Payer: Medicaid Other | Admitting: Internal Medicine
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
## 2017-01-23 ENCOUNTER — Other Ambulatory Visit: Payer: Self-pay | Admitting: Oncology

## 2017-01-23 ENCOUNTER — Inpatient Hospital Stay: Payer: Medicaid Other | Attending: Internal Medicine | Admitting: Internal Medicine

## 2017-01-23 VITALS — BP 147/76 | HR 59 | Temp 97.9°F | Resp 18 | Ht 62.0 in | Wt 147.8 lb

## 2017-01-23 DIAGNOSIS — R232 Flushing: Secondary | ICD-10-CM | POA: Diagnosis not present

## 2017-01-23 DIAGNOSIS — Z79810 Long term (current) use of selective estrogen receptor modulators (SERMs): Secondary | ICD-10-CM | POA: Insufficient documentation

## 2017-01-23 DIAGNOSIS — C50812 Malignant neoplasm of overlapping sites of left female breast: Secondary | ICD-10-CM

## 2017-01-23 DIAGNOSIS — Z170 Estrogen receptor positive status [ER+]: Secondary | ICD-10-CM | POA: Diagnosis not present

## 2017-01-23 DIAGNOSIS — C50912 Malignant neoplasm of unspecified site of left female breast: Secondary | ICD-10-CM

## 2017-01-23 MED ORDER — ANASTROZOLE 1 MG PO TABS: 1.0000 mg | ORAL_TABLET | Freq: Every day | ORAL | 3 refills | Status: AC
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
## 2017-01-23 NOTE — Assessment & Plan Note (Addendum)
#  Stage I breast cancer ER/PR positive HER-2 negative; low risk Oncotype- 4.  Currently not taking Aromatase inhibitor or calcium/vitamin D do to financial hardship. She has 3 refills at her pharmacy of Arimidex.. She states she is going to start back on her medications immediately. Clinically no evidence of recurrence.  Right breast mammogram March 2016 was BIRADS 1. Need to schedule mammogram and bone denisty ASAP.   #  Hot flashes likely from-  Postmenopausal status\    #  Patient follow-up with [place] in approximately 3 months; CBC CMP.

## 2017-02-08 ENCOUNTER — Other Ambulatory Visit: Payer: Medicaid Other

## 2017-04-24 ENCOUNTER — Other Ambulatory Visit: Payer: Self-pay | Admitting: *Deleted

## 2017-04-24 DIAGNOSIS — C50812 Malignant neoplasm of overlapping sites of left female breast: Secondary | ICD-10-CM

## 2017-04-25 ENCOUNTER — Inpatient Hospital Stay: Payer: Medicaid Other | Attending: Obstetrics and Gynecology

## 2017-04-25 ENCOUNTER — Inpatient Hospital Stay: Payer: Medicaid Other | Admitting: Internal Medicine

## 2017-04-25 DIAGNOSIS — Z170 Estrogen receptor positive status [ER+]: Secondary | ICD-10-CM

## 2017-04-25 DIAGNOSIS — C50812 Malignant neoplasm of overlapping sites of left female breast: Secondary | ICD-10-CM | POA: Insufficient documentation
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
## 2017-04-25 NOTE — Assessment & Plan Note (Deleted)
#  Stage I breast cancer ER/PR positive HER-2 negative; low risk Oncotype- 4.  Currently not taking Aromatase inhibitor or calcium/vitamin D do to financial hardship. She has 3 refills at her pharmacy of Arimidex.. She states she is going to start back on her medications immediately. Clinically no evidence of recurrence.  Right breast mammogram March 2016 was BIRADS 1. Need to schedule mammogram and bone denisty ASAP.   #  Hot flashes likely from-  Postmenopausal status\    #  Patient follow-up with us in approximately 3 months; CBC CMP. 

## 2017-04-29 ENCOUNTER — Other Ambulatory Visit: Payer: Self-pay

## 2017-06-21 ENCOUNTER — Emergency Department: Payer: Medicare Other

## 2017-06-21 ENCOUNTER — Encounter: Payer: Self-pay | Admitting: Emergency Medicine

## 2017-06-21 ENCOUNTER — Inpatient Hospital Stay
Admission: EM | Admit: 2017-06-21 | Discharge: 2017-07-09 | DRG: 177 | Disposition: E | Payer: Medicare Other | Attending: Internal Medicine | Admitting: Internal Medicine

## 2017-06-21 ENCOUNTER — Other Ambulatory Visit: Payer: Self-pay

## 2017-06-21 DIAGNOSIS — Z9119 Patient's noncompliance with other medical treatment and regimen: Secondary | ICD-10-CM

## 2017-06-21 DIAGNOSIS — F10929 Alcohol use, unspecified with intoxication, unspecified: Secondary | ICD-10-CM | POA: Diagnosis not present

## 2017-06-21 DIAGNOSIS — C50912 Malignant neoplasm of unspecified site of left female breast: Secondary | ICD-10-CM | POA: Diagnosis present

## 2017-06-21 DIAGNOSIS — Z8507 Personal history of malignant neoplasm of pancreas: Secondary | ICD-10-CM

## 2017-06-21 DIAGNOSIS — J690 Pneumonitis due to inhalation of food and vomit: Secondary | ICD-10-CM | POA: Diagnosis not present

## 2017-06-21 DIAGNOSIS — J9601 Acute respiratory failure with hypoxia: Secondary | ICD-10-CM | POA: Diagnosis present

## 2017-06-21 DIAGNOSIS — E860 Dehydration: Secondary | ICD-10-CM | POA: Diagnosis present

## 2017-06-21 DIAGNOSIS — Z808 Family history of malignant neoplasm of other organs or systems: Secondary | ICD-10-CM

## 2017-06-21 DIAGNOSIS — I469 Cardiac arrest, cause unspecified: Secondary | ICD-10-CM | POA: Diagnosis not present

## 2017-06-21 DIAGNOSIS — A419 Sepsis, unspecified organism: Secondary | ICD-10-CM

## 2017-06-21 DIAGNOSIS — R042 Hemoptysis: Secondary | ICD-10-CM | POA: Diagnosis present

## 2017-06-21 DIAGNOSIS — Z79899 Other long term (current) drug therapy: Secondary | ICD-10-CM

## 2017-06-21 DIAGNOSIS — Z880 Allergy status to penicillin: Secondary | ICD-10-CM

## 2017-06-21 DIAGNOSIS — J189 Pneumonia, unspecified organism: Secondary | ICD-10-CM | POA: Diagnosis present

## 2017-06-21 DIAGNOSIS — Y908 Blood alcohol level of 240 mg/100 ml or more: Secondary | ICD-10-CM | POA: Diagnosis present

## 2017-06-21 DIAGNOSIS — Z170 Estrogen receptor positive status [ER+]: Secondary | ICD-10-CM

## 2017-06-21 DIAGNOSIS — Z9012 Acquired absence of left breast and nipple: Secondary | ICD-10-CM

## 2017-06-21 DIAGNOSIS — J4520 Mild intermittent asthma, uncomplicated: Secondary | ICD-10-CM | POA: Diagnosis present

## 2017-06-21 DIAGNOSIS — F10120 Alcohol abuse with intoxication, uncomplicated: Secondary | ICD-10-CM | POA: Diagnosis present

## 2017-06-21 DIAGNOSIS — F10920 Alcohol use, unspecified with intoxication, uncomplicated: Secondary | ICD-10-CM

## 2017-06-21 HISTORY — DX: Malignant neoplasm of pancreas, unspecified: C25.9

## 2017-06-21 LAB — COMPREHENSIVE METABOLIC PANEL
ALK PHOS: 108 U/L (ref 38–126)
ALT: 37 U/L (ref 14–54)
AST: 56 U/L — ABNORMAL HIGH (ref 15–41)
Albumin: 4.4 g/dL (ref 3.5–5.0)
Anion gap: 13 (ref 5–15)
BUN: 10 mg/dL (ref 6–20)
CALCIUM: 9.4 mg/dL (ref 8.9–10.3)
CO2: 24 mmol/L (ref 22–32)
Chloride: 105 mmol/L (ref 101–111)
Creatinine, Ser: 0.72 mg/dL (ref 0.44–1.00)
Glucose, Bld: 117 mg/dL — ABNORMAL HIGH (ref 65–99)
Potassium: 3.6 mmol/L (ref 3.5–5.1)
SODIUM: 142 mmol/L (ref 135–145)
TOTAL PROTEIN: 8.4 g/dL — AB (ref 6.5–8.1)
Total Bilirubin: 0.7 mg/dL (ref 0.3–1.2)

## 2017-06-21 LAB — CBC
HCT: 40.1 % (ref 35.0–47.0)
Hemoglobin: 13.2 g/dL (ref 12.0–16.0)
MCH: 28.8 pg (ref 26.0–34.0)
MCHC: 32.9 g/dL (ref 32.0–36.0)
MCV: 87.6 fL (ref 80.0–100.0)
Platelets: 207 10*3/uL (ref 150–440)
RBC: 4.58 MIL/uL (ref 3.80–5.20)
RDW: 14.7 % — ABNORMAL HIGH (ref 11.5–14.5)
WBC: 11.1 10*3/uL — AB (ref 3.6–11.0)

## 2017-06-21 LAB — LIPASE, BLOOD: LIPASE: 21 U/L (ref 11–51)

## 2017-06-21 MED ORDER — PROMETHAZINE HCL 25 MG/ML IJ SOLN
12.5000 mg | Freq: Once | INTRAMUSCULAR | Status: AC
  Administered 2017-06-22: 12.5 mg via INTRAMUSCULAR
  Filled 2017-06-21: qty 1

## 2017-06-21 MED ORDER — SODIUM CHLORIDE 0.9 % IV BOLUS (SEPSIS)
1000.0000 mL | Freq: Once | INTRAVENOUS | Status: AC
  Administered 2017-06-21: 1000 mL via INTRAVENOUS

## 2017-06-21 MED ORDER — ONDANSETRON HCL 4 MG/2ML IJ SOLN
4.0000 mg | Freq: Once | INTRAMUSCULAR | Status: AC | PRN
  Administered 2017-06-21: 4 mg via INTRAVENOUS
  Filled 2017-06-21: qty 2

## 2017-06-21 MED ORDER — ACETAMINOPHEN 500 MG PO TABS: 1000.0000 mg | ORAL_TABLET | ORAL | Status: DC

## 2017-06-21 NOTE — ED Notes (Signed)
Pt placed on 3.5 Liters O2 nasal canula d/t saturation 84% when sleeping.   Fall band and bed alarm placed on patient

## 2017-06-21 NOTE — ED Provider Notes (Signed)
[facility] Emergency Department Provider Note   ____________________________________________   First MD Initiated Contact with Patient 06/27/2017 2327     (approximate)  I have reviewed the triage vital signs and the nursing notes.   HISTORY  Chief Complaint Alcohol Intoxication and Nausea  The patient is somnolent, and reports she cannot recall many events  HPI Alexis Hernandez is a 56 y.o. female presents for evaluation of abdominal pain  Patient reports that she has been using alcohol heavily today.  She began experiencing abdominal pain across the upper abdomen with feeling of severe nausea but no vomiting.  She denies fevers or chills.  No chest pain or trouble breathing.  Does report that she drank "a lot" today.  She denies daily alcohol use however.  She denies any diarrhea or loose or bloody stools.  Denies taking any medications or blood thinners.  She does report a history of breast cancer in the past, currently not on any treatment or therapy thought to be in remission.  Past Medical History:  Diagnosis Date  . Asthma   . Breast cancer ([place]) 2016   left breast  . Cancer of left female breast  ([place])    breast  . Pancreatic cancer [facility])     Patient Active Problem List   Diagnosis Date Noted  . Carcinoma of overlapping sites of left breast in female, estrogen receptor positive ([place]) 04/25/2017  . Acid reflux 09/14/2015  . Cardiac murmur 09/14/2015    Past Surgical History:  Procedure Laterality Date  . ABDOMINAL HYSTERECTOMY    . MASTECTOMY Left 09/30/14  . SENTINEL NODE BIOPSY      Prior to Admission medications   Medication Sig Start Date End Date Taking? Authorizing Provider  albuterol (PROAIR HFA) 108 (90 Base) MCG/ACT inhaler Inhale into the lungs.    [provider]  anastrozole (ARIMIDEX) 1 MG tablet Take 1 tablet (1 mg total) by mouth daily. 01/23/17   Cammie Sickle, MD  calcium-vitamin D (OSCAL) 250-125  MG-UNIT per tablet Take 1 tablet by mouth daily. Patient not taking: Reported on 01/23/2017 03/18/15   Leia Alf, MD  esomeprazole (NEXIUM) 40 MG capsule Take 40 mg by mouth 2 (two) times daily before a meal.     [provider]  gabapentin (NEURONTIN) 300 MG capsule Take 1 capsule (300 mg total) by mouth 3 (three) times daily. Patient not taking: Reported on 01/23/2017 09/14/15   Cammie Sickle, MD  ibuprofen (ADVIL,MOTRIN) 600 MG tablet Take 1 tablet (600 mg total) by mouth every 8 (eight) hours as needed. 08/13/15   Paulette Blanch, MD  lidocaine (LIDODERM) 5 % Place onto the skin.    [provider]  traMADol (ULTRAM) 50 MG tablet Take by mouth.    [provider]    Allergies Penicillins  Family History  Problem Relation Age of Onset  . Lung cancer Father   . Throat cancer Cousin     Social History Social History   Tobacco Use  . Smoking status: Never Smoker  . Smokeless tobacco: Never Used  Substance Use Topics  . Alcohol use: Yes  . Drug use: No    Review of Systems Constitutional: No fever/chills Eyes: No visual changes. ENT: No sore throat.  Feels dry. Cardiovascular: Denies chest pain. Respiratory: Denies shortness of breath. Gastrointestinal: No vomiting but feels that she could throw up.  No diarrhea.  No constipation. Genitourinary: Negative for dysuria.  Denies any risk of pregnancy.  No vaginal discharge. Musculoskeletal: Negative for back pain. Skin: Negative for rash. Neurological: Negative for headaches, focal weakness or numbness.    ____________________________________________   PHYSICAL EXAM:  VITAL SIGNS: ED Triage Vitals  Enc Vitals Group     BP 06/13/2017 2307 140/74     Pulse Rate 06/28/2017 2307 94     Resp 06/20/2017 2307 14     Temp 06/11/2017 2307 100.1 F (37.8 C)     Temp Source 07/02/2017 2307 Oral     SpO2 06/28/2017 2307 91 %     Weight 06/30/2017 2308 142 lb (64.4 kg)     Height 06/16/2017 2308 5\' 2"  (1.575 m)      Head Circumference --      Peak Flow --      Pain Score --      Pain Loc --      Pain Edu? --      Excl. in [place]? --     Constitutional: Alert and oriented.  Mildly ill-appearing, Eyes: Conjunctivae are normal.  Patient does have ataxia with lateral gaze movements. Head: Atraumatic. Nose: No congestion/rhinnorhea. Mouth/Throat: Mucous membranes are moist. Neck: No stridor.   Cardiovascular: Normal rate, regular rhythm. Grossly normal heart sounds.  Good peripheral circulation. Respiratory: Normal respiratory effort.  No retractions. Lungs CTAB. Gastrointestinal: Soft and notably tender across the epigastrium and left upper quadrant with no lower abdominal tenderness.  No mass.  No hernias.  Difficult to assess focality of tenderness, though it seems to be primarily in the epigastrium and left upper quadrant musculoskeletal: No lower extremity tenderness nor edema. Neurologic: Slightly slurred speech.  Normal and symmetric movement of the arms and legs as well as facial muscles.  No gross focal neurologic deficits are appreciated.  She has ataxia of the upper extremities bilaterally.  No tremors. Skin:  Skin is warm, dry and intact. No rash noted. Psychiatric: Mood and affect are calm somewhat somnolent  ____________________________________________   LABS (all labs ordered are listed, but only abnormal results are displayed)  Labs Reviewed  COMPREHENSIVE METABOLIC PANEL - Abnormal; Notable for the following components:      Result Value   Glucose, Bld 117 (*)    Total Protein 8.4 (*)    AST 56 (*)    All other components within normal limits  CBC - Abnormal; Notable for the following components:   WBC 11.1 (*)    RDW 14.7 (*)    All other components within normal limits  URINALYSIS, COMPLETE (UACMP) WITH MICROSCOPIC - Abnormal; Notable for the following components:   Color, Urine STRAW (*)    APPearance CLEAR (*)    Specific Gravity, Urine 1.035 (*)    Hgb urine dipstick  SMALL (*)    Ketones, ur 5 (*)    Squamous Epithelial / LPF 0-5 (*)    All other components within normal limits  ETHANOL - Abnormal; Notable for the following components:   Alcohol, Ethyl (B) 312 (*)    All other components within normal limits  GLUCOSE, CAPILLARY - Abnormal; Notable for the following components:   Glucose-Capillary 102 (*)    All other components within normal limits  CULTURE, BLOOD (ROUTINE X 2)  CULTURE, BLOOD (ROUTINE X 2)  LIPASE, BLOOD  LACTIC ACID, PLASMA  LACTIC ACID, PLASMA  POC URINE PREG, ED  CBG MONITORING, ED   ____________________________________________  EKG   ____________________________________________  RADIOLOGY  Dg Chest 2 View  Result Date: 06/22/2017 CLINICAL DATA:  Fever.  Vomiting. EXAM:  CHEST  2 VIEW COMPARISON:  Lung bases from abdominal CT earlier this day. FINDINGS: Patchy consolidation in the right upper lobe. Linear right lower lobe atelectasis. Left lung is clear. Normal heart size and mediastinal contours for technique. No pneumothorax or pleural effusion. No pulmonary edema. Remote left rib fractures. IMPRESSION: Patchy right upper lobe consolidation, this may be pneumonia or aspiration in the setting of vomiting. Linear right lower lobe atelectasis. Electronically Signed   By: Jeb Levering M.D.   On: 06/22/2017 04:15   Ct Head Wo Contrast  Result Date: 06/22/2017 CLINICAL DATA:  Acute onset of altered mental status. Nausea and vomiting. Personal history of pancreatic cancer. EXAM: CT HEAD WITHOUT CONTRAST TECHNIQUE: Contiguous axial images were obtained from the base of the skull through the vertex without intravenous contrast. COMPARISON:  CT of the head performed 01/11/2008 FINDINGS: Brain: No evidence of acute infarction, hemorrhage, hydrocephalus, extra-axial collection or mass lesion/mass effect. The posterior fossa, including the cerebellum, brainstem and fourth ventricle, is within normal limits. The third and lateral  ventricles, and basal ganglia are unremarkable in appearance. The cerebral hemispheres are symmetric in appearance, with normal gray-white differentiation. No mass effect or midline shift is seen. Vascular: No hyperdense vessel or unexpected calcification. Skull: There is no evidence of fracture; visualized osseous structures are unremarkable in appearance. Sinuses/Orbits: The visualized portions of the orbits are within normal limits. There is partial opacification of the left maxillary sinus, right frontal sinus and ethmoid air cells. The remaining paranasal sinuses and mastoid air cells are well-aerated. Other: No significant soft tissue abnormalities are seen. IMPRESSION: 1. No acute intracranial pathology seen on CT. 2. Partial opacification of the left maxillary sinus, right frontal sinus and ethmoid air cells. Electronically Signed   By: Garald Balding M.D.   On: 06/22/2017 04:32   Ct Abdomen Pelvis W Contrast  Result Date: 06/22/2017 CLINICAL DATA:  Nausea and vomiting. EXAM: CT ABDOMEN AND PELVIS WITH CONTRAST TECHNIQUE: Multidetector CT imaging of the abdomen and pelvis was performed using the standard protocol following bolus administration of intravenous contrast. CONTRAST:  17mL ISOVUE-300 IOPAMIDOL (ISOVUE-300) INJECTION 61% COMPARISON:  CT 08/13/2015 FINDINGS: Lower chest: Elevated right hemidiaphragm with adjacent basilar atelectasis. Minimal right pleural thickening. No frank pleural effusion. Mild cardiomegaly. Post left mastectomy. Hepatobiliary: 11 mm subcapsular low-density lesion in the left lobe of the liver, unchanged. Enlarged right lobe spanning 21 cm cranial caudal. No new hepatic lesion. Gallbladder physiologically distended, no calcified stone. No biliary dilatation. Pancreas: No ductal dilatation or inflammation. Patient with reported history of pancreatic cancer, no visualized pancreatic mass. Spleen: Normal in size without focal abnormality. Adrenals/Urinary Tract: No adrenal  nodule. No hydronephrosis or perinephric edema. Homogeneous renal enhancement with symmetric excretion on delayed phase imaging. Tiny low-density lesions in the left kidney are too small to accurately characterize. Urinary bladder is physiologically distended without wall thickening. Stomach/Bowel: Lack of enteric contrast limits bowel assessment. Small hiatal hernia and wall thickening of the distal esophagus. Stomach is nondistended. No evidence of bowel wall thickening, inflammation or obstruction. Transverse and descending colon are nondistended and not well assessed. Normal appendix. Vascular/Lymphatic: Normal caliber abdominal aorta. No enlarged abdominal or pelvic lymph nodes. Reproductive: Status post hysterectomy. No adnexal masses. Other: No free air or ascites. No intra-abdominal fluid collection. Upper abdominal rectus diastases. Musculoskeletal: Remote left rib fractures. Degenerative change of both sacroiliac joints. There are no acute or suspicious osseous abnormalities. IMPRESSION: 1. Small hiatal hernia with distal esophageal wall thickening, may be reflux or esophagitis.  2. No additional acute abnormality in the abdomen or pelvis. Electronically Signed   By: Jeb Levering M.D.   On: 06/22/2017 02:08      CT abdomen pelvis with some changes which may represent reflux or esophagitis but otherwise no acute intra-abdominal pathology.  CT head reviewed no acute  Chest x-ray, concerning for pneumonia right upper lung ____________________________________________   PROCEDURES  Procedure(s) performed: None  Procedures  Critical Care performed: No  ____________________________________________   INITIAL IMPRESSION / ASSESSMENT AND PLAN / ED COURSE  Pertinent labs & imaging results that were available during my care of the patient were reviewed by me and considered in my medical decision making (see chart for details).  Patient is noted to have slurred speech, appears  intoxicated based on clinical examination and she does report heavily drinking today.  Triage is notable for a low-grade fever, also complaining of abdominal pain which began in the setting of heavy alcohol use.  Differential diagnosis is broad, but it appears most focal upon upper abdominal etiology, certainly would consider possibilities such as gastritis, cholecystitis, pancreatitis, etc.  She denies any risk of pregnancy and is beyond menopause.  We will check an ethanol level, hydrate provide Zofran, and based on her complaint and focality of tenderness in the upper abdomen with a low-grade fever I ordered a CT scan.  Differential diagnosis includes, but is not limited to, biliary disease (biliary colic, acute cholecystitis, cholangitis, choledocholithiasis, etc), intrathoracic causes for epigastric abdominal pain including ACS, gastritis, duodenitis, pancreatitis, small bowel or large bowel obstruction, abdominal aortic aneurysm, hernia, and gastritis.   Clinical Course as of Jun 22 458  Fri Jun 21, 2017  2355 Patient is now having vomiting.  Patient having acidic emesis.  Vomited multiple times a emesis basin.  Second dose of Zofran has been ordered, now I have ordered additional Phenergan.  She is alert, appears uncomfortable and is vomiting.  Reports ongoing upper abdominal pain  [MQ]    Clinical Course User Index [MQ] Delman Kitten, MD   ----------------------------------------- 3:51 AM on 06/22/2017 -----------------------------------------  Patient reexamined.  She is able to give her orientation including name B and [employer] [facility], but on exam she has ongoing notable ataxia with gaze and slurring of the speech with ongoing somnolence at this time.  Though I suspect this is related to her acute alcohol intoxication with notably elevated ethanol level, she has been in our care for approximately 5 hours now on has notable somnolence.  Of ordered a head CT and recheck of her blood glucose  at this time as well as additional liter of fluid.  She remains with very low-grade temperature, not well explained and given her associated vomiting and intoxication I ordered a chest x-ray to assure that there is no evidence of aspiration though she has not demonstrate any hypoxia and does not complain of any shortness of breath or cough at this time.  Nurse reports that family has been calling, I went and discussed with the patient and she is not given consent for [place] to speak to her daughters at this time.  Thus, no medical information was conveyed by the nurse to her daughter's other than notifying that she is here for evaluation which they were already aware.  I have not spoken with patient's family personally as patient has not given per permission.   ----------------------------------------- 4:59 AM on 06/22/2017 -----------------------------------------  Patient is alert, family is now at the bedside and the patient consents to review of her  medical history with the family.  I discussed with the family and noted that she appears to have pneumonia causing her fevers.  Family reports that she has been coughing and they were concerned she could have pneumonia, at this point I suspect likely aspiration pneumonia which may have been related to vomiting and alcohol intoxication/gastritis.  She is alert, oriented, still slightly somnolent but improving mental status.  Febrile with associated tachycardia, associated alcohol intoxication I believe the patient is a elevated risk for worsening of her condition and have recommended inpatient treatment.  Discussed with Dr. Marcille Blanco will start the patient on aztreonam given her penicillin allergy and high suspicion for aspiration pneumonia ____________________________________________   FINAL CLINICAL IMPRESSION(S) / ED DIAGNOSES  Final diagnoses:  Sepsis, due to unspecified organism ([place])  Aspiration pneumonia of right upper lobe, unspecified aspiration  pneumonia type ([place])  Acute alcoholic intoxication without complication ([place])      NEW MEDICATIONS STARTED DURING THIS VISIT:  This SmartLink is deprecated. Use AVSMEDLIST instead to display the medication list for a patient.   Note:  This document was prepared using Dragon voice recognition software and may include unintentional dictation errors.     Delman Kitten, MD 06/22/17 0500

## 2017-06-21 NOTE — ED Notes (Signed)
ED Provider at bedside. 

## 2017-06-21 NOTE — ED Triage Notes (Signed)
Pt bib [facility] from home d/t N/V. ETOH on board. Pt hx pancreatic cancer. VSS in route.

## 2017-06-22 ENCOUNTER — Emergency Department: Payer: Medicare Other

## 2017-06-22 ENCOUNTER — Other Ambulatory Visit: Payer: Self-pay

## 2017-06-22 DIAGNOSIS — J690 Pneumonitis due to inhalation of food and vomit: Secondary | ICD-10-CM | POA: Diagnosis present

## 2017-06-22 LAB — LACTIC ACID, PLASMA
Lactic Acid, Venous: 2.4 mmol/L (ref 0.5–1.9)
Lactic Acid, Venous: 1 mmol/L (ref 0.5–1.9)
Lactic Acid, Venous: 2.8 mmol/L (ref 0.5–1.9)

## 2017-06-22 LAB — URINALYSIS, COMPLETE (UACMP) WITH MICROSCOPIC
Bacteria, UA: NONE SEEN
Bilirubin Urine: NEGATIVE
GLUCOSE, UA: NEGATIVE mg/dL
Ketones, ur: 5 mg/dL — AB
Leukocytes, UA: NEGATIVE
NITRITE: NEGATIVE
PH: 5 (ref 5.0–8.0)
Protein, ur: NEGATIVE mg/dL
SPECIFIC GRAVITY, URINE: 1.035 — AB (ref 1.005–1.030)

## 2017-06-22 LAB — INFLUENZA PANEL BY PCR (TYPE A & B)
INFLAPCR: NEGATIVE
Influenza B By PCR: NEGATIVE

## 2017-06-22 LAB — ETHANOL: Alcohol, Ethyl (B): 312 mg/dL (ref <10)

## 2017-06-22 LAB — GLUCOSE, CAPILLARY: GLUCOSE-CAPILLARY: 102 mg/dL — AB (ref 65–99)

## 2017-06-22 LAB — TSH: TSH: 0.212 u[IU]/mL — ABNORMAL LOW (ref 0.350–4.500)

## 2017-06-22 LAB — PREGNANCY, URINE: PREG TEST UR: NEGATIVE

## 2017-06-22 MED ORDER — FOLIC ACID 1 MG PO TABS
1.0000 mg | ORAL_TABLET | Freq: Every day | ORAL | Status: DC
  Administered 2017-06-23 – 2017-06-25 (×3): 1 mg via ORAL
  Filled 2017-06-22 (×3): qty 1

## 2017-06-22 MED ORDER — ACETAMINOPHEN 650 MG RE SUPP: 650.0000 mg | Freq: Four times a day (QID) | RECTAL | Status: DC | PRN

## 2017-06-22 MED ORDER — LORAZEPAM 2 MG PO TABS: 0.0000 mg | ORAL_TABLET | Freq: Four times a day (QID) | ORAL | Status: DC

## 2017-06-22 MED ORDER — ONDANSETRON HCL 4 MG PO TABS: 4.0000 mg | ORAL_TABLET | Freq: Four times a day (QID) | ORAL | Status: DC | PRN

## 2017-06-22 MED ORDER — DOCUSATE SODIUM 100 MG PO CAPS
100.0000 mg | ORAL_CAPSULE | Freq: Two times a day (BID) | ORAL | Status: DC
  Administered 2017-06-22 – 2017-06-24 (×3): 100 mg via ORAL
  Filled 2017-06-22 (×5): qty 1

## 2017-06-22 MED ORDER — VITAMIN B-1 100 MG PO TABS
100.0000 mg | ORAL_TABLET | Freq: Every day | ORAL | Status: DC
  Administered 2017-06-23 – 2017-06-25 (×3): 100 mg via ORAL
  Filled 2017-06-22 (×3): qty 1

## 2017-06-22 MED ORDER — IOPAMIDOL (ISOVUE-300) INJECTION 61%
100.0000 mL | Freq: Once | INTRAVENOUS | Status: AC | PRN
  Administered 2017-06-22: 100 mL via INTRAVENOUS

## 2017-06-22 MED ORDER — THIAMINE HCL 100 MG/ML IJ SOLN
100.0000 mg | Freq: Every day | INTRAMUSCULAR | Status: DC
  Filled 2017-06-22: qty 2

## 2017-06-22 MED ORDER — LORAZEPAM 1 MG PO TABS
1.0000 mg | ORAL_TABLET | Freq: Four times a day (QID) | ORAL | Status: AC | PRN
Start: 2017-06-22 — End: 2017-06-25

## 2017-06-22 MED ORDER — ENOXAPARIN SODIUM 40 MG/0.4ML ~~LOC~~ SOLN
40.0000 mg | SUBCUTANEOUS | Status: DC
  Administered 2017-06-22 – 2017-06-25 (×4): 40 mg via SUBCUTANEOUS
  Filled 2017-06-22 (×4): qty 0.4

## 2017-06-22 MED ORDER — LORAZEPAM 2 MG PO TABS: 0.0000 mg | ORAL_TABLET | Freq: Two times a day (BID) | ORAL | Status: DC

## 2017-06-22 MED ORDER — IBUPROFEN 400 MG PO TABS
800.0000 mg | ORAL_TABLET | Freq: Once | ORAL | Status: AC
  Administered 2017-06-22: 800 mg via ORAL
  Filled 2017-06-22: qty 2

## 2017-06-22 MED ORDER — BENZONATATE 100 MG PO CAPS
100.0000 mg | ORAL_CAPSULE | Freq: Three times a day (TID) | ORAL | Status: DC | PRN
  Administered 2017-06-22 – 2017-06-25 (×6): 100 mg via ORAL
  Filled 2017-06-22 (×6): qty 1

## 2017-06-22 MED ORDER — DEXTROSE 5 % IV SOLN
2.0000 g | Freq: Three times a day (TID) | INTRAVENOUS | Status: DC
  Administered 2017-06-22: 2 g via INTRAVENOUS
  Filled 2017-06-22 (×3): qty 2

## 2017-06-22 MED ORDER — SODIUM CHLORIDE 0.9 % IV SOLN
INTRAVENOUS | Status: DC
  Administered 2017-06-22 – 2017-06-25 (×7): via INTRAVENOUS

## 2017-06-22 MED ORDER — CLINDAMYCIN PHOSPHATE 300 MG/50ML IV SOLN
300.0000 mg | Freq: Three times a day (TID) | INTRAVENOUS | Status: DC
  Administered 2017-06-22 – 2017-06-23 (×4): 300 mg via INTRAVENOUS
  Filled 2017-06-22 (×5): qty 50

## 2017-06-22 MED ORDER — SODIUM CHLORIDE 0.9 % IV BOLUS (SEPSIS)
1000.0000 mL | Freq: Once | INTRAVENOUS | Status: AC
  Administered 2017-06-22: 1000 mL via INTRAVENOUS

## 2017-06-22 MED ORDER — LORAZEPAM 2 MG/ML IJ SOLN: 1.0000 mg | Freq: Four times a day (QID) | INTRAMUSCULAR | Status: AC | PRN

## 2017-06-22 MED ORDER — ACETAMINOPHEN 325 MG PO TABS
650.0000 mg | ORAL_TABLET | Freq: Four times a day (QID) | ORAL | Status: DC | PRN
Start: 2017-06-22 — End: 2017-06-26
  Administered 2017-06-22 – 2017-06-25 (×8): 650 mg via ORAL
  Filled 2017-06-22 (×8): qty 2

## 2017-06-22 MED ORDER — ONDANSETRON HCL 4 MG/2ML IJ SOLN
4.0000 mg | Freq: Four times a day (QID) | INTRAMUSCULAR | Status: DC | PRN
  Administered 2017-06-22 – 2017-06-24 (×2): 4 mg via INTRAVENOUS
  Filled 2017-06-22 (×3): qty 2

## 2017-06-22 MED ORDER — ADULT MULTIVITAMIN W/MINERALS CH
1.0000 | ORAL_TABLET | Freq: Every day | ORAL | Status: DC
  Administered 2017-06-23 – 2017-06-25 (×3): 1 via ORAL
  Filled 2017-06-22 (×3): qty 1

## 2017-06-22 MED ORDER — MORPHINE SULFATE (PF) 2 MG/ML IV SOLN
2.0000 mg | Freq: Once | INTRAVENOUS | Status: AC
  Administered 2017-06-22: 2 mg via INTRAVENOUS
  Filled 2017-06-22: qty 1

## 2017-06-22 NOTE — ED Notes (Signed)
Date and time results received: 06/22/17 0550 (use smartphrase ".now" to insert current time)  Test: Lactic Acid Critical Value: 2.4  Name of Provider Notified: Dr. Jacqualine Code  Orders Received? Or Actions Taken?:

## 2017-06-22 NOTE — Progress Notes (Signed)
Patient arrived to floor from ED, transferred to bed with 1 assist.  Within 5 mins in room patient started wreathing in pain, screaming about her legs and in pain.  Patient tossed and turned, sideways in bed, inconsolable.  The charge nurse and other RN's arrived in room to assist with assesment.  RN informed on call MD, Dr, Marcille Blanco of change in status, new onset of pain and requested something to administer.  Patient continued to be inconsolable, even after medication, RN requested a bed side assessment

## 2017-06-22 NOTE — Progress Notes (Signed)
MD notified of Temp, orders given for 800mg  of Ibuprofen.

## 2017-06-22 NOTE — Progress Notes (Signed)
Notified MD of temp at 1435. No new orders given. See MAR for administration of Tylenol.

## 2017-06-22 NOTE — ED Notes (Signed)
Pt. Returned to tx. room in stable condition with no acute changes since departure from unit for scans.   

## 2017-06-22 NOTE — ED Notes (Signed)
Ophelia Charter RN, aware of bed ready

## 2017-06-22 NOTE — ED Notes (Signed)
Family at bedside. 

## 2017-06-22 NOTE — H&P (Addendum)
Alexis Hernandez is an 56 y.o. female.   Chief Complaint: Nausea HPI: The patient with past medical history of breast cancer and remote pancreatic cancer presents to the emergency department due to nausea and vomiting.  The patient admits that she drank a lot today.  She admits to upper epigastric pain and occasional shortness of breath.  She was found to be febrile and mildly hypoxic upon arrival.  Chest x-ray showed pneumonia.  The patient was placed on aztreonam and supplemental oxygen prior to the emergency department staff calling the hospitalist service for admission.  Past Medical History:  Diagnosis Date  . Asthma   . Breast cancer ([place]) 2016   left breast  . Cancer of left female breast  ([place])    breast  . Pancreatic cancer [facility])     Past Surgical History:  Procedure Laterality Date  . ABDOMINAL HYSTERECTOMY    . MASTECTOMY Left 09/30/14  . SENTINEL NODE BIOPSY      Family History  Problem Relation Age of Onset  . Lung cancer Father   . Throat cancer Cousin    Social History:  reports that  has never smoked. she has never used smokeless tobacco. She reports that she drinks alcohol. She reports that she does not use drugs.  Allergies:  Allergies  Allergen Reactions  . Penicillins Swelling    Medications Prior to Admission  Medication Sig Dispense Refill  . anastrozole (ARIMIDEX) 1 MG tablet Take 1 tablet (1 mg total) by mouth daily. (Patient not taking: Reported on 06/22/2017) 90 tablet 3  . calcium-vitamin D (OSCAL) 250-125 MG-UNIT per tablet Take 1 tablet by mouth daily. (Patient not taking: Reported on 01/23/2017) 30 tablet 0  . gabapentin (NEURONTIN) 300 MG capsule Take 1 capsule (300 mg total) by mouth 3 (three) times daily. (Patient not taking: Reported on 01/23/2017) 90 capsule 3  . ibuprofen (ADVIL,MOTRIN) 600 MG tablet Take 1 tablet (600 mg total) by mouth every 8 (eight) hours as needed. 15 tablet 0    Results for orders placed or performed during the hospital  encounter of 06/24/2017 (from the past 48 hour(s))  Lipase, blood     Status: None   Collection Time: 07/05/2017 11:06 PM  Result Value Ref Range   Lipase 21 11 - 51 U/L  Comprehensive metabolic panel     Status: Abnormal   Collection Time: 06/24/2017 11:06 PM  Result Value Ref Range   Sodium 142 135 - 145 mmol/L   Potassium 3.6 3.5 - 5.1 mmol/L   Chloride 105 101 - 111 mmol/L   CO2 24 22 - 32 mmol/L   Glucose, Bld 117 (H) 65 - 99 mg/dL   BUN 10 6 - 20 mg/dL   Creatinine, Ser 0.72 0.44 - 1.00 mg/dL   Calcium 9.4 8.9 - 10.3 mg/dL   Total Protein 8.4 (H) 6.5 - 8.1 g/dL   Albumin 4.4 3.5 - 5.0 g/dL   AST 56 (H) 15 - 41 U/L   ALT 37 14 - 54 U/L   Alkaline Phosphatase 108 38 - 126 U/L   Total Bilirubin 0.7 0.3 - 1.2 mg/dL   GFR calc non Af Amer >60 >60 mL/min   GFR calc Af Amer >60 >60 mL/min    Comment: (NOTE) The eGFR has been calculated using the CKD EPI equation. This calculation has not been validated in all clinical situations. eGFR's persistently <60 mL/min signify possible Chronic Kidney Disease.    Anion gap 13 5 - 15  CBC  Status: Abnormal   Collection Time: 06/30/2017 11:06 PM  Result Value Ref Range   WBC 11.1 (H) 3.6 - 11.0 K/uL   RBC 4.58 3.80 - 5.20 MIL/uL   Hemoglobin 13.2 12.0 - 16.0 g/dL   HCT 40.1 35.0 - 47.0 %   MCV 87.6 80.0 - 100.0 fL   MCH 28.8 26.0 - 34.0 pg   MCHC 32.9 32.0 - 36.0 g/dL   RDW 14.7 (H) 11.5 - 14.5 %   Platelets 207 150 - 440 K/uL  Ethanol     Status: Abnormal   Collection Time: 07/08/2017 11:06 PM  Result Value Ref Range   Alcohol, Ethyl (B) 312 (HH) <10 mg/dL    Comment: CRITICAL RESULT CALLED TO, READ BACK BY AND VERIFIED WITH ALLISON PATE AT 0024 06/22/17.PMH        LOWEST DETECTABLE LIMIT FOR SERUM ALCOHOL IS 10 mg/dL FOR MEDICAL PURPOSES ONLY   Urinalysis, Complete w Microscopic     Status: Abnormal   Collection Time: 06/22/17  2:47 AM  Result Value Ref Range   Color, Urine STRAW (A) YELLOW   APPearance CLEAR (A) CLEAR    Specific Gravity, Urine 1.035 (H) 1.005 - 1.030   pH 5.0 5.0 - 8.0   Glucose, UA NEGATIVE NEGATIVE mg/dL   Hgb urine dipstick SMALL (A) NEGATIVE   Bilirubin Urine NEGATIVE NEGATIVE   Ketones, ur 5 (A) NEGATIVE mg/dL   Protein, ur NEGATIVE NEGATIVE mg/dL   Nitrite NEGATIVE NEGATIVE   Leukocytes, UA NEGATIVE NEGATIVE   RBC / HPF 6-30 0 - 5 RBC/hpf   WBC, UA 0-5 0 - 5 WBC/hpf   Bacteria, UA NONE SEEN NONE SEEN   Squamous Epithelial / LPF 0-5 (A) NONE SEEN  Pregnancy, urine     Status: None   Collection Time: 06/22/17  2:47 AM  Result Value Ref Range   Preg Test, Ur NEGATIVE NEGATIVE  Glucose, capillary     Status: Abnormal   Collection Time: 06/22/17  4:24 AM  Result Value Ref Range   Glucose-Capillary 102 (H) 65 - 99 mg/dL  Lactic acid, plasma     Status: Abnormal   Collection Time: 06/22/17  5:05 AM  Result Value Ref Range   Lactic Acid, Venous 2.4 (HH) 0.5 - 1.9 mmol/L    Comment: CRITICAL RESULT CALLED TO, READ BACK BY AND VERIFIED WITH KALA KEEN AT 3220 06/22/17.PMH  Blood Culture (routine x 2)     Status: None (Preliminary result)   Collection Time: 06/22/17  5:05 AM  Result Value Ref Range   Specimen Description BLOOD RIGHT HAND    Special Requests      BOTTLES DRAWN AEROBIC AND ANAEROBIC Blood Culture adequate volume   Culture NO GROWTH < 12 HOURS    Report Status PENDING   Blood Culture (routine x 2)     Status: None (Preliminary result)   Collection Time: 06/22/17  5:05 AM  Result Value Ref Range   Specimen Description BLOOD LEFT ANTECUBITAL    Special Requests      BOTTLES DRAWN AEROBIC AND ANAEROBIC Blood Culture adequate volume   Culture NO GROWTH < 12 HOURS    Report Status PENDING   Influenza panel by PCR (type A & B)     Status: None   Collection Time: 06/22/17  5:05 AM  Result Value Ref Range   Influenza A By PCR NEGATIVE NEGATIVE   Influenza B By PCR NEGATIVE NEGATIVE    Comment: (NOTE) The Xpert Xpress Flu assay is  intended as an aid in the diagnosis of   influenza and should not be used as a sole basis for treatment.  This  assay is FDA approved for nasopharyngeal swab specimens only. Nasal  washings and aspirates are unacceptable for Xpert Xpress Flu testing.    Dg Chest 2 View  Result Date: 06/22/2017 CLINICAL DATA:  Fever.  Vomiting. EXAM: CHEST  2 VIEW COMPARISON:  Lung bases from abdominal CT earlier this day. FINDINGS: Patchy consolidation in the right upper lobe. Linear right lower lobe atelectasis. Left lung is clear. Normal heart size and mediastinal contours for technique. No pneumothorax or pleural effusion. No pulmonary edema. Remote left rib fractures. IMPRESSION: Patchy right upper lobe consolidation, this may be pneumonia or aspiration in the setting of vomiting. Linear right lower lobe atelectasis. Electronically Signed   By: Jeb Levering M.D.   On: 06/22/2017 04:15   Ct Head Wo Contrast  Result Date: 06/22/2017 CLINICAL DATA:  Acute onset of altered mental status. Nausea and vomiting. Personal history of pancreatic cancer. EXAM: CT HEAD WITHOUT CONTRAST TECHNIQUE: Contiguous axial images were obtained from the base of the skull through the vertex without intravenous contrast. COMPARISON:  CT of the head performed 01/11/2008 FINDINGS: Brain: No evidence of acute infarction, hemorrhage, hydrocephalus, extra-axial collection or mass lesion/mass effect. The posterior fossa, including the cerebellum, brainstem and fourth ventricle, is within normal limits. The third and lateral ventricles, and basal ganglia are unremarkable in appearance. The cerebral hemispheres are symmetric in appearance, with normal gray-white differentiation. No mass effect or midline shift is seen. Vascular: No hyperdense vessel or unexpected calcification. Skull: There is no evidence of fracture; visualized osseous structures are unremarkable in appearance. Sinuses/Orbits: The visualized portions of the orbits are within normal limits. There is partial  opacification of the left maxillary sinus, right frontal sinus and ethmoid air cells. The remaining paranasal sinuses and mastoid air cells are well-aerated. Other: No significant soft tissue abnormalities are seen. IMPRESSION: 1. No acute intracranial pathology seen on CT. 2. Partial opacification of the left maxillary sinus, right frontal sinus and ethmoid air cells. Electronically Signed   By: Garald Balding M.D.   On: 06/22/2017 04:32   Ct Abdomen Pelvis W Contrast  Result Date: 06/22/2017 CLINICAL DATA:  Nausea and vomiting. EXAM: CT ABDOMEN AND PELVIS WITH CONTRAST TECHNIQUE: Multidetector CT imaging of the abdomen and pelvis was performed using the standard protocol following bolus administration of intravenous contrast. CONTRAST:  146m ISOVUE-300 IOPAMIDOL (ISOVUE-300) INJECTION 61% COMPARISON:  CT 08/13/2015 FINDINGS: Lower chest: Elevated right hemidiaphragm with adjacent basilar atelectasis. Minimal right pleural thickening. No frank pleural effusion. Mild cardiomegaly. Post left mastectomy. Hepatobiliary: 11 mm subcapsular low-density lesion in the left lobe of the liver, unchanged. Enlarged right lobe spanning 21 cm cranial caudal. No new hepatic lesion. Gallbladder physiologically distended, no calcified stone. No biliary dilatation. Pancreas: No ductal dilatation or inflammation. Patient with reported history of pancreatic cancer, no visualized pancreatic mass. Spleen: Normal in size without focal abnormality. Adrenals/Urinary Tract: No adrenal nodule. No hydronephrosis or perinephric edema. Homogeneous renal enhancement with symmetric excretion on delayed phase imaging. Tiny low-density lesions in the left kidney are too small to accurately characterize. Urinary bladder is physiologically distended without wall thickening. Stomach/Bowel: Lack of enteric contrast limits bowel assessment. Small hiatal hernia and wall thickening of the distal esophagus. Stomach is nondistended. No evidence of  bowel wall thickening, inflammation or obstruction. Transverse and descending colon are nondistended and not well assessed. Normal appendix. Vascular/Lymphatic: Normal caliber  abdominal aorta. No enlarged abdominal or pelvic lymph nodes. Reproductive: Status post hysterectomy. No adnexal masses. Other: No free air or ascites. No intra-abdominal fluid collection. Upper abdominal rectus diastases. Musculoskeletal: Remote left rib fractures. Degenerative change of both sacroiliac joints. There are no acute or suspicious osseous abnormalities. IMPRESSION: 1. Small hiatal hernia with distal esophageal wall thickening, may be reflux or esophagitis. 2. No additional acute abnormality in the abdomen or pelvis. Electronically Signed   By: Jeb Levering M.D.   On: 06/22/2017 02:08    Review of Systems  Constitutional: Negative for chills and fever.  HENT: Negative for sore throat and tinnitus.   Eyes: Negative for blurred vision and redness.  Respiratory: Negative for cough and shortness of breath.   Cardiovascular: Negative for chest pain, palpitations, orthopnea and PND.  Gastrointestinal: Positive for abdominal pain, nausea and vomiting. Negative for diarrhea.  Genitourinary: Negative for dysuria, frequency and urgency.  Musculoskeletal: Negative for joint pain and myalgias.  Skin: Negative for rash.       No lesions  Neurological: Negative for speech change, focal weakness and weakness.  Endo/Heme/Allergies: Does not bruise/bleed easily.       No temperature intolerance  Psychiatric/Behavioral: Negative for depression and suicidal ideas.    Blood pressure 133/69, pulse 94, temperature 100.3 F (37.9 C), temperature source Oral, resp. rate 16, height _0  (1.575 m), weight 64.4 kg (142 lb), SpO2 95 %. Physical Exam  Vitals reviewed. Constitutional: She is oriented to person, place, and time. She appears well-developed and well-nourished. No distress.  HENT:  Head: Normocephalic and atraumatic.   Mouth/Throat: Oropharynx is clear and moist.  Eyes: Conjunctivae and EOM are normal. Pupils are equal, round, and reactive to light. No scleral icterus.  Neck: Normal range of motion. Neck supple. No JVD present. No tracheal deviation present. No thyromegaly present.  Cardiovascular: Normal rate, regular rhythm and normal heart sounds. Exam reveals no gallop and no friction rub.  No murmur heard. Respiratory: Effort normal and breath sounds normal.  GI: Soft. Bowel sounds are normal. She exhibits no distension. There is no tenderness.  Genitourinary:  Genitourinary Comments: Deferred  Musculoskeletal: Normal range of motion. She exhibits no edema.  Lymphadenopathy:    She has no cervical adenopathy.  Neurological: She is alert and oriented to person, place, and time. No cranial nerve deficit. She exhibits normal muscle tone.  Skin: Skin is warm and dry. No rash noted. No erythema.  Psychiatric: She has a normal mood and affect. Her behavior is normal. Judgment and thought content normal.     Assessment/Plan This is a 56 year old female admitted for aspiration pneumonia. 1.  Aspiration pneumonia: Continue aztreonam.  Supplemental oxygen as needed.  Breathing treatments as needed. 2.  Dehydration: Secondary to nausea and vomiting due to alcohol intoxication and/or pancreatic insufficiency.  Continue intravenous fluid and provide antiemetic. 3.  Alcohol abuse: Start CIWA withdrawal scale in 48 hours.  The patient also reports possible illicit ingestions.  She complains of leg cramps and has been occasionally agitated.  Morphine for pain 4.  Breast cancer: Patient has been noncompliant with her Arimidex.  Continue while in hospital. 5.  DVT prophylaxis: Lovenox 6.  GI prophylaxis: Pantoprazole The patient is a full code.  Time spent on admission orders and patient care approximately 45 minutes  Harrie Foreman, MD 06/22/2017, 7:50 AM

## 2017-06-22 NOTE — ED Notes (Signed)
Pt unable to provide story d/t intoxication. Under impression significant other called 911 for vomiting and abdominal pain.

## 2017-06-22 NOTE — Progress Notes (Signed)
MD notified of increased Temp. Orders given for cooling blanket and alternating between ibuprofen and tylenol.

## 2017-06-22 NOTE — Progress Notes (Signed)
Pharmacy Antibiotic Note  Alexis Hernandez is a 56 y.o. female admitted on 06/12/2017 with sepsis.  Pharmacy has been consulted for aztreonam dosing.  Plan: Aztreonam 2 grams q 8 hours ordered.  Height: 5\' 2"  (157.5 cm) Weight: 142 lb (64.4 kg) IBW/kg (Calculated) : 50.1  Temp (24hrs), Avg:99.8 F (37.7 C), Min:99.5 F (37.5 C), Max:100.1 F (37.8 C)  Recent Labs  Lab 06/15/2017 2306  WBC 11.1*  CREATININE 0.72    Estimated Creatinine Clearance: 68.3 mL/min (by C-G formula based on SCr of 0.72 mg/dL).    Allergies  Allergen Reactions  . Penicillins Swelling    Antimicrobials this admission: aztreonam 12/15 >>   >>   Dose adjustments this admission:   Microbiology results: 12/15 BCx: pending      12/15 UA: (-) 12/15 CXR: R upper lobe consolidation   Thank you for allowing pharmacy to be a part of this patient's care.  Karisma Meiser S 06/22/2017 5:10 AM

## 2017-06-22 NOTE — Progress Notes (Signed)
Patient briefly seen. Patient is 56 year old female admitted for aspiration pneumonia with history of EtOH abuse.  Change in about a 6 and clindamycin for aspiration pneumonia Start CIWA protocol  Patient complaining of leg pain and reports that Tylenol helps. Continue IV fluids

## 2017-06-22 NOTE — ED Notes (Signed)
Pt informed of continued need for urine specimen.

## 2017-06-22 NOTE — ED Notes (Signed)
Alexis Hernandez (mother) [phone]

## 2017-06-23 DIAGNOSIS — Z9119 Patient's noncompliance with other medical treatment and regimen: Secondary | ICD-10-CM | POA: Diagnosis not present

## 2017-06-23 DIAGNOSIS — Z8507 Personal history of malignant neoplasm of pancreas: Secondary | ICD-10-CM | POA: Diagnosis not present

## 2017-06-23 DIAGNOSIS — Z880 Allergy status to penicillin: Secondary | ICD-10-CM | POA: Diagnosis not present

## 2017-06-23 DIAGNOSIS — R042 Hemoptysis: Secondary | ICD-10-CM | POA: Diagnosis present

## 2017-06-23 DIAGNOSIS — I469 Cardiac arrest, cause unspecified: Secondary | ICD-10-CM | POA: Diagnosis not present

## 2017-06-23 DIAGNOSIS — J189 Pneumonia, unspecified organism: Secondary | ICD-10-CM | POA: Diagnosis present

## 2017-06-23 DIAGNOSIS — J690 Pneumonitis due to inhalation of food and vomit: Secondary | ICD-10-CM | POA: Diagnosis present

## 2017-06-23 DIAGNOSIS — J4520 Mild intermittent asthma, uncomplicated: Secondary | ICD-10-CM | POA: Diagnosis present

## 2017-06-23 DIAGNOSIS — E860 Dehydration: Secondary | ICD-10-CM | POA: Diagnosis present

## 2017-06-23 DIAGNOSIS — Z170 Estrogen receptor positive status [ER+]: Secondary | ICD-10-CM | POA: Diagnosis not present

## 2017-06-23 DIAGNOSIS — C50912 Malignant neoplasm of unspecified site of left female breast: Secondary | ICD-10-CM | POA: Diagnosis present

## 2017-06-23 DIAGNOSIS — Z808 Family history of malignant neoplasm of other organs or systems: Secondary | ICD-10-CM | POA: Diagnosis not present

## 2017-06-23 DIAGNOSIS — Z79899 Other long term (current) drug therapy: Secondary | ICD-10-CM | POA: Diagnosis not present

## 2017-06-23 DIAGNOSIS — Y908 Blood alcohol level of 240 mg/100 ml or more: Secondary | ICD-10-CM | POA: Diagnosis present

## 2017-06-23 DIAGNOSIS — F10120 Alcohol abuse with intoxication, uncomplicated: Secondary | ICD-10-CM | POA: Diagnosis present

## 2017-06-23 DIAGNOSIS — Z9012 Acquired absence of left breast and nipple: Secondary | ICD-10-CM | POA: Diagnosis not present

## 2017-06-23 DIAGNOSIS — J9601 Acute respiratory failure with hypoxia: Secondary | ICD-10-CM | POA: Diagnosis present

## 2017-06-23 DIAGNOSIS — F10929 Alcohol use, unspecified with intoxication, unspecified: Secondary | ICD-10-CM | POA: Diagnosis present

## 2017-06-23 LAB — BASIC METABOLIC PANEL
ANION GAP: 8 (ref 5–15)
BUN: 11 mg/dL (ref 6–20)
CALCIUM: 7.8 mg/dL — AB (ref 8.9–10.3)
CO2: 23 mmol/L (ref 22–32)
CREATININE: 0.55 mg/dL (ref 0.44–1.00)
Chloride: 108 mmol/L (ref 101–111)
GLUCOSE: 104 mg/dL — AB (ref 65–99)
Potassium: 3.5 mmol/L (ref 3.5–5.1)
Sodium: 139 mmol/L (ref 135–145)

## 2017-06-23 MED ORDER — LORAZEPAM 2 MG PO TABS: 0.0000 mg | ORAL_TABLET | Freq: Four times a day (QID) | ORAL | Status: AC

## 2017-06-23 MED ORDER — GUAIFENESIN 100 MG/5ML PO SOLN
5.0000 mL | ORAL | Status: DC | PRN
Start: 2017-06-23 — End: 2017-06-26
  Administered 2017-06-23 – 2017-06-25 (×6): 100 mg via ORAL
  Filled 2017-06-23 (×7): qty 5

## 2017-06-23 MED ORDER — SODIUM CHLORIDE 0.9 % IV SOLN
1.0000 g | Freq: Three times a day (TID) | INTRAVENOUS | Status: DC
  Administered 2017-06-23 – 2017-06-25 (×8): 1 g via INTRAVENOUS
  Filled 2017-06-23 (×13): qty 1

## 2017-06-23 MED ORDER — LORAZEPAM 2 MG PO TABS
0.0000 mg | ORAL_TABLET | Freq: Two times a day (BID) | ORAL | Status: AC
  Filled 2017-06-23: qty 1

## 2017-06-23 NOTE — Progress Notes (Signed)
[facility] [place] NAME: Lovenia Debruler    MR#:  [id number]  DATE OF BIRTH:  [birth year]  SUBJECTIVE:   Patient still with cough this a.m. Denies shortness of breath. No wheezing. She wants to quit drinking alcohol.  REVIEW OF SYSTEMS:    Review of Systems  Constitutional: Negative for fever, chills weight loss HENT: Negative for ear pain, nosebleeds, congestion, facial swelling, rhinorrhea, neck pain, neck stiffness and ear discharge.   Respiratory: Positive for cough NO shortness of breath or wheezing  Cardiovascular: Negative for chest pain, palpitations and leg swelling.  Gastrointestinal: Negative for heartburn, abdominal pain, vomiting, diarrhea or consitpation Genitourinary: Negative for dysuria, urgency, frequency, hematuria Musculoskeletal: Negative for back pain or joint pain Neurological: Negative for dizziness, seizures, syncope, focal weakness,  numbness and headaches.  Hematological: Does not bruise/bleed easily.  Psychiatric/Behavioral: Negative for hallucinations, confusion, dysphoric mood    Tolerating Diet: yes      DRUG ALLERGIES:   Allergies  Allergen Reactions  . Penicillins Swelling    VITALS:  Blood pressure (!) 118/45, pulse 98, temperature 100 F (37.8 C), resp. rate 18, height 5\' 2"  (1.575 m), weight 64.4 kg (142 lb), SpO2 94 %.  PHYSICAL EXAMINATION:  Constitutional: Appears well-developed and well-nourished. No distress. HENT: Normocephalic. Marland Kitchen Oropharynx is clear and moist.  Eyes: Conjunctivae and EOM are normal. PERRLA, no scleral icterus.  Neck: Normal ROM. Neck supple. No JVD. No tracheal deviation. CVS: RRR, S1/S2 +, no murmurs, no gallops, no carotid bruit.  Pulmonary: Effort normal right crackles at bases no wheezing or rhonchi  Abdominal: Soft. BS +,  no distension, tenderness, rebound or guarding.  Musculoskeletal: Normal range of motion. No edema and no tenderness.  Neuro: Alert. CN 2-12  grossly intact. No focal deficits. Skin: Skin is warm and dry. No rash noted. Psychiatric: Normal mood and affect.      LABORATORY PANEL:   CBC Recent Labs  Lab 06/29/2017 2306  WBC 11.1*  HGB 13.2  HCT 40.1  PLT 207   ------------------------------------------------------------------------------------------------------------------  Chemistries  Recent Labs  Lab 07/05/2017 2306 06/23/17 0422  NA 142 139  K 3.6 3.5  CL 105 108  CO2 24 23  GLUCOSE 117* 104*  BUN 10 11  CREATININE 0.72 0.55  CALCIUM 9.4 7.8*  AST 56*  --   ALT 37  --   ALKPHOS 108  --   BILITOT 0.7  --    ------------------------------------------------------------------------------------------------------------------  Cardiac Enzymes No results for input(s): TROPONINI in the last 168 hours. ------------------------------------------------------------------------------------------------------------------  RADIOLOGY:  Dg Chest 2 View  Result Date: 06/22/2017 CLINICAL DATA:  Fever.  Vomiting. EXAM: CHEST  2 VIEW COMPARISON:  Lung bases from abdominal CT earlier this day. FINDINGS: Patchy consolidation in the right upper lobe. Linear right lower lobe atelectasis. Left lung is clear. Normal heart size and mediastinal contours for technique. No pneumothorax or pleural effusion. No pulmonary edema. Remote left rib fractures. IMPRESSION: Patchy right upper lobe consolidation, this may be pneumonia or aspiration in the setting of vomiting. Linear right lower lobe atelectasis. Electronically Signed   By: Jeb Levering M.D.   On: 06/22/2017 04:15   Ct Head Wo Contrast  Result Date: 06/22/2017 CLINICAL DATA:  Acute onset of altered mental status. Nausea and vomiting. Personal history of pancreatic cancer. EXAM: CT HEAD WITHOUT CONTRAST TECHNIQUE: Contiguous axial images were obtained from the base of the skull through the vertex without intravenous contrast. COMPARISON:  CT of the head  performed 01/11/2008  FINDINGS: Brain: No evidence of acute infarction, hemorrhage, hydrocephalus, extra-axial collection or mass lesion/mass effect. The posterior fossa, including the cerebellum, brainstem and fourth ventricle, is within normal limits. The third and lateral ventricles, and basal ganglia are unremarkable in appearance. The cerebral hemispheres are symmetric in appearance, with normal gray-white differentiation. No mass effect or midline shift is seen. Vascular: No hyperdense vessel or unexpected calcification. Skull: There is no evidence of fracture; visualized osseous structures are unremarkable in appearance. Sinuses/Orbits: The visualized portions of the orbits are within normal limits. There is partial opacification of the left maxillary sinus, right frontal sinus and ethmoid air cells. The remaining paranasal sinuses and mastoid air cells are well-aerated. Other: No significant soft tissue abnormalities are seen. IMPRESSION: 1. No acute intracranial pathology seen on CT. 2. Partial opacification of the left maxillary sinus, right frontal sinus and ethmoid air cells. Electronically Signed   By: Garald Balding M.D.   On: 06/22/2017 04:32   Ct Abdomen Pelvis W Contrast  Result Date: 06/22/2017 CLINICAL DATA:  Nausea and vomiting. EXAM: CT ABDOMEN AND PELVIS WITH CONTRAST TECHNIQUE: Multidetector CT imaging of the abdomen and pelvis was performed using the standard protocol following bolus administration of intravenous contrast. CONTRAST:  171mL ISOVUE-300 IOPAMIDOL (ISOVUE-300) INJECTION 61% COMPARISON:  CT 08/13/2015 FINDINGS: Lower chest: Elevated right hemidiaphragm with adjacent basilar atelectasis. Minimal right pleural thickening. No frank pleural effusion. Mild cardiomegaly. Post left mastectomy. Hepatobiliary: 11 mm subcapsular low-density lesion in the left lobe of the liver, unchanged. Enlarged right lobe spanning 21 cm cranial caudal. No new hepatic lesion. Gallbladder physiologically distended, no  calcified stone. No biliary dilatation. Pancreas: No ductal dilatation or inflammation. Patient with reported history of pancreatic cancer, no visualized pancreatic mass. Spleen: Normal in size without focal abnormality. Adrenals/Urinary Tract: No adrenal nodule. No hydronephrosis or perinephric edema. Homogeneous renal enhancement with symmetric excretion on delayed phase imaging. Tiny low-density lesions in the left kidney are too small to accurately characterize. Urinary bladder is physiologically distended without wall thickening. Stomach/Bowel: Lack of enteric contrast limits bowel assessment. Small hiatal hernia and wall thickening of the distal esophagus. Stomach is nondistended. No evidence of bowel wall thickening, inflammation or obstruction. Transverse and descending colon are nondistended and not well assessed. Normal appendix. Vascular/Lymphatic: Normal caliber abdominal aorta. No enlarged abdominal or pelvic lymph nodes. Reproductive: Status post hysterectomy. No adnexal masses. Other: No free air or ascites. No intra-abdominal fluid collection. Upper abdominal rectus diastases. Musculoskeletal: Remote left rib fractures. Degenerative change of both sacroiliac joints. There are no acute or suspicious osseous abnormalities. IMPRESSION: 1. Small hiatal hernia with distal esophageal wall thickening, may be reflux or esophagitis. 2. No additional acute abnormality in the abdomen or pelvis. Electronically Signed   By: Jeb Levering M.D.   On: 06/22/2017 02:08     ASSESSMENT AND PLAN:   56 year old female with a history of asthma, mild intermittent EtOH abuse who presents with nausea.  1. Acute hypoxic respiratory failure in the setting of aspiration pneumonia Wean oxygen as tolerated  2. Aspiration pneumonia with persistent fever: Change clindamycin to meropenem Follow-up on final blood culture  3. EtOH abuse: Continue CIWA protocol  4. Mild intermittent asthma without signs of  exacerbation  5. History of breast cancer: Patient not compliant with ARIMEDEX       Management plans discussed with the patient and she is in agreement.  CODE STATUS: full  TOTAL TIME TAKING CARE OF THIS PATIENT: 25 minutes.  POSSIBLE D/C 1-2 days, DEPENDING ON CLINICAL CONDITION.   Kimberlly Norgard M.D on 06/23/2017 at 10:17 AM  Between 7am to 6pm - Pager - [phone] After 6pm go to [email or web address] - password [email or web address] [facility]  Office  [phone]  CC: Primary care physician; Patient, No Pcp Per  Note: This dictation was prepared with Dragon dictation along with smaller phrase technology. Any transcriptional errors that result from this process are unintentional.

## 2017-06-23 NOTE — Progress Notes (Signed)
Pharmacy Antibiotic Note  Alexis Hernandez is a 56 y.o. female admitted on 06/12/2017 with sepsis.  Pharmacy has been consulted for meropenem dosing.  Plan: Meropenem 1 g iv q 8 hours.   Height: 5\' 2"  (157.5 cm) Weight: 142 lb (64.4 kg) IBW/kg (Calculated) : 50.1  Temp (24hrs), Avg:101 F (38.3 C), Min:99.2 F (37.3 C), Max:103.1 F (39.5 C)  Recent Labs  Lab 06/19/2017 2306 06/22/17 0505 06/22/17 1239 06/22/17 1855 06/23/17 0422  WBC 11.1*  --   --   --   --   CREATININE 0.72  --   --   --  0.55  LATICACIDVEN  --  2.4* 1.0 2.8*  --     Estimated Creatinine Clearance: 68.3 mL/min (by C-G formula based on SCr of 0.55 mg/dL).    Allergies  Allergen Reactions  . Penicillins Swelling    Antimicrobials this admission: aztreonam 12/15 x 1 clindamycin 12/15 >> 12/16 Meropenem 12/16 >>  Dose adjustments this admission:   Microbiology results: 12/15 BCx: NGTD  Thank you for allowing pharmacy to be a part of this patient's care.  Ulice Dash D 06/23/2017 7:42 AM

## 2017-06-24 ENCOUNTER — Inpatient Hospital Stay: Payer: Medicare Other

## 2017-06-24 LAB — RAPID HIV SCREEN (HIV 1/2 AB+AG)
HIV 1/2 ANTIBODIES: NONREACTIVE
HIV-1 P24 ANTIGEN - HIV24: NONREACTIVE

## 2017-06-24 MED ORDER — AZITHROMYCIN 250 MG PO TABS
250.0000 mg | ORAL_TABLET | Freq: Every day | ORAL | Status: DC
  Administered 2017-06-25: 250 mg via ORAL
  Filled 2017-06-24: qty 1

## 2017-06-24 MED ORDER — IOPAMIDOL (ISOVUE-370) INJECTION 76%
75.0000 mL | Freq: Once | INTRAVENOUS | Status: AC | PRN
Start: 2017-06-24 — End: 2017-06-24
  Administered 2017-06-24: 75 mL via INTRAVENOUS

## 2017-06-24 MED ORDER — AZITHROMYCIN 250 MG PO TABS
500.0000 mg | ORAL_TABLET | Freq: Every day | ORAL | Status: AC
  Administered 2017-06-24: 500 mg via ORAL
  Filled 2017-06-24: qty 2

## 2017-06-24 NOTE — Progress Notes (Signed)
I looked at pt's hand and could not find a good vein to try. She has a 24 from ED which was difficult to achieve. IV team to try for CT scan.

## 2017-06-24 NOTE — Progress Notes (Signed)
[facility] [place] NAME: Alexis Hernandez    MR#:  [id number]  DATE OF BIRTH:  Jan 11, 1961  SUBJECTIVE:   Patient continues to have shortness of breath cough and congestion  REVIEW OF SYSTEMS:    Review of Systems  Constitutional: Negative for fever, chills weight loss HENT: Negative for ear pain, nosebleeds, congestion, facial swelling, rhinorrhea, neck pain, neck stiffness and ear discharge.   Respiratory: Positive for cough NO shortness of breath or wheezing  Cardiovascular: Negative for chest pain, palpitations and leg swelling.  Gastrointestinal: Negative for heartburn, abdominal pain, vomiting, diarrhea or consitpation Genitourinary: Negative for dysuria, urgency, frequency, hematuria Musculoskeletal: Negative for back pain or joint pain Neurological: Negative for dizziness, seizures, syncope, focal weakness,  numbness and headaches.  Hematological: Does not bruise/bleed easily.  Psychiatric/Behavioral: Negative for hallucinations, confusion, dysphoric mood    Tolerating Diet: yes      DRUG ALLERGIES:   Allergies  Allergen Reactions  . Penicillins Swelling    VITALS:  Blood pressure 132/73, pulse 89, temperature (!) 101 F (38.3 C), temperature source Oral, resp. rate 16, height 5\' 2"  (1.575 m), weight 142 lb (64.4 kg), SpO2 95 %.  PHYSICAL EXAMINATION:  Constitutional: Appears well-developed and well-nourished. No distress. HENT: Normocephalic. Marland Kitchen Oropharynx is clear and moist.  Eyes: Conjunctivae and EOM are normal. PERRLA, no scleral icterus.  Neck: Normal ROM. Neck supple. No JVD. No tracheal deviation. CVS: RRR, S1/S2 +, no murmurs, no gallops, no carotid bruit.  Pulmonary: Right lungs rhonchus breath sounds Abdominal: Soft. BS +,  no distension, tenderness, rebound or guarding.  Musculoskeletal: Normal range of motion. No edema and no tenderness.  Neuro: Alert. CN 2-12 grossly intact. No focal deficits. Skin: Skin  is warm and dry. No rash noted. Psychiatric: Normal mood and affect.      LABORATORY PANEL:   CBC Recent Labs  Lab 06/08/2017 2306  WBC 11.1*  HGB 13.2  HCT 40.1  PLT 207   ------------------------------------------------------------------------------------------------------------------  Chemistries  Recent Labs  Lab 07/08/2017 2306 06/23/17 0422  NA 142 139  K 3.6 3.5  CL 105 108  CO2 24 23  GLUCOSE 117* 104*  BUN 10 11  CREATININE 0.72 0.55  CALCIUM 9.4 7.8*  AST 56*  --   ALT 37  --   ALKPHOS 108  --   BILITOT 0.7  --    ------------------------------------------------------------------------------------------------------------------  Cardiac Enzymes No results for input(s): TROPONINI in the last 168 hours. ------------------------------------------------------------------------------------------------------------------  RADIOLOGY:  Ct Angio Chest Pe W Or Wo Contrast  Result Date: 06/24/2017 CLINICAL DATA:  Acute hypoxic respiratory failure in the setting of aspiration pneumonia. History of breast cancer. EXAM: CT ANGIOGRAPHY CHEST WITH CONTRAST TECHNIQUE: Multidetector CT imaging of the chest was performed using the standard protocol during bolus administration of intravenous contrast. Multiplanar CT image reconstructions and MIPs were obtained to evaluate the vascular anatomy. CONTRAST:  11mL ISOVUE-370 IOPAMIDOL (ISOVUE-370) INJECTION 76% COMPARISON:  CT chest dated August 13, 2015. FINDINGS: Cardiovascular: Satisfactory opacification of the pulmonary arteries to the segmental level. No evidence of pulmonary embolism. Normal heart size. No pericardial effusion. Normal caliber thoracic aorta. Mediastinum/Nodes: Prominent right paratracheal and hilar lymph nodes measuring up to 1.2 cm in short axis are likely reactive. No axillary lymphadenopathy. The thyroid gland, esophagus, and trachea demonstrate no significant abnormalities. Lungs/Pleura: Small bilateral pleural  effusions. Extensive ground-glass density with more peripheral consolidative opacities in the right upper lobe. Right upper lobe peribronchial thickening. Bibasilar atelectasis. No  suspicious pulmonary nodules. No pneumothorax. Upper Abdomen: No acute abnormality. Musculoskeletal: Prior left mastectomy. No acute or significant osseous findings. Old left-sided rib fractures. Review of the MIP images confirms the above findings. IMPRESSION: 1. No evidence of pulmonary embolism. 2. Right upper lobe pneumonia and/or aspiration. 3. Small bilateral pleural effusions. Electronically Signed   By: Titus Dubin M.D.   On: 06/24/2017 17:28     ASSESSMENT AND PLAN:   56 year old female with a history of asthma, mild intermittent EtOH abuse who presents with nausea.  1. Acute hypoxic respiratory failure in the setting of aspiration pneumonia Wean oxygen as tolerated CT scan shows persistent infiltrate patient continues to be febrile  2. Aspiration pneumonia with persistent fever: Continue meropenem and Flagyl for anaerobic cover Infectious disease consult due to persistent fevers Flu test was negative Check HIV   3. EtOH abuse: Continue CIWA protocol  4. Mild intermittent asthma without signs of exacerbation  5. History of breast cancer: Patient not compliant with ARIMEDEX       Management plans discussed with the patient and she is in agreement.  CODE STATUS: full  TOTAL TIME TAKING CARE OF THIS PATIENT: 25 minutes.     POSSIBLE D/C 1-2 days, DEPENDING ON CLINICAL CONDITION.   Dustin Flock M.D on 06/24/2017 at 6:11 PM  Between 7am to 6pm - Pager - [phone] After 6pm go to [email or web address] - password [email or web address] [facility]  Office  [phone]  CC: Primary care physician; Patient, No Pcp Per  Note: This dictation was prepared with Dragon dictation along with smaller phrase technology. Any transcriptional errors that result from this process are  unintentional.

## 2017-06-24 NOTE — Consult Note (Signed)
[facility] Infectious Disease    Reason for Consult: Fever    Referring Physician:  Bettey Costa Date of Admission:  07/01/2017   Active Problems:   Aspiration pneumonia (HCC)   Pneumonia  HPI: Alexis Hernandez is a 56 y.o. female with hx ETOh abuse, prior cancer of pancreas and hx asthma admitted with NV and cough and SOB as well as abd pain. She had on admission mild hypoxia, wbc 11, temp 101 up to 103, CT abd neg and  CXR with RUL opacity and CT sinuses with possible sinusitis.  She has been drinking heavily and smokes marijuana. Reports feeling ill with cough for abotu 2 weeks, some fevers. No sick contacts, lives with her boyfriend. Denies HX TB contacts.   Past Medical History:  Diagnosis Date  . Asthma   . Breast cancer ([place]) 2016   left breast  . Cancer of left female breast  ([place])    breast  . Pancreatic cancer [facility])    Past Surgical History:  Procedure Laterality Date  . ABDOMINAL HYSTERECTOMY    . MASTECTOMY Left 09/30/14  . SENTINEL NODE BIOPSY     Social History   Tobacco Use  . Smoking status: Never Smoker  . Smokeless tobacco: Never Used  Substance Use Topics  . Alcohol use: Yes  . Drug use: No   Family History  Problem Relation Age of Onset  . Lung cancer Father   . Throat cancer Cousin     Allergies:  Allergies  Allergen Reactions  . Penicillins Swelling    Current antibiotics: Antibiotics Given (last 72 hours)    Date/Time Action Medication Dose Rate   06/22/17 0738 New Bag/Given   aztreonam (AZACTAM) 2 g in dextrose 5 % 50 mL IVPB 2 g 100 mL/hr   06/22/17 0915 New Bag/Given   clindamycin (CLEOCIN) IVPB 300 mg 300 mg 100 mL/hr   06/22/17 1420 New Bag/Given   clindamycin (CLEOCIN) IVPB 300 mg 300 mg 100 mL/hr   06/22/17 2125 New Bag/Given   clindamycin (CLEOCIN) IVPB 300 mg 300 mg 100 mL/hr   06/23/17 0527 New Bag/Given   clindamycin (CLEOCIN) IVPB 300 mg 300 mg 100 mL/hr   06/23/17 1020 New Bag/Given   meropenem (MERREM) 1 g in sodium  chloride 0.9 % 100 mL IVPB 1 g 200 mL/hr   06/23/17 1503 New Bag/Given   meropenem (MERREM) 1 g in sodium chloride 0.9 % 100 mL IVPB 1 g 200 mL/hr   06/23/17 2138 New Bag/Given   meropenem (MERREM) 1 g in sodium chloride 0.9 % 100 mL IVPB 1 g 200 mL/hr   06/24/17 0520 New Bag/Given   meropenem (MERREM) 1 g in sodium chloride 0.9 % 100 mL IVPB 1 g 200 mL/hr   06/24/17 1756 New Bag/Given   meropenem (MERREM) 1 g in sodium chloride 0.9 % 100 mL IVPB 1 g 200 mL/hr      MEDICATIONS: . docusate sodium  100 mg Oral BID  . enoxaparin (LOVENOX) injection  40 mg Subcutaneous Q24H  . folic acid  1 mg Oral Daily  . LORazepam  0-4 mg Oral Q12H  . multivitamin with minerals  1 tablet Oral Daily  . thiamine  100 mg Oral Daily   Or  . thiamine  100 mg Intravenous Daily    Review of Systems - 11 systems reviewed and negative per HPI   OBJECTIVE: Temp:  [100 F (37.8 C)-101.1 F (38.4 C)] 100.8 F (38.2 C) (12/17 2000) Pulse Rate:  [  75-91] 89 (12/17 1734) Resp:  [14-18] 16 (12/17 1734) BP: (104-132)/(41-73) 132/73 (12/17 1734) SpO2:  [95 %-100 %] 95 % (12/17 1734) Physical Exam  Constitutional:  oriented to person, place, and time. Disheveled, chronically ill appearing,  HENT: [place]/AT, PERRLA, no scleral icterus Mouth/Throat: Oropharynx is clear and dry . No oropharyngeal exudate.  Cardiovascular: Normal rate, regular rhythm and normal heart sounds. Pulmonary/Chest:bil rhonchi  Neck = supple, no nuchal rigidity Abdominal: Soft. Bowel sounds are normal.  exhibits no distension. There is no tenderness.  Lymphadenopathy: no cervical adenopathy. No axillary adenopathy Neurological: alert and oriented to person, place, and time.  Skin: Skin is warm and dry. No rash noted. No erythema.  Psychiatric: a normal mood and affect.  behavior is normal.    LABS: Results for orders placed or performed during the hospital encounter of 06/10/2017 (from the past 48 hour(s))  Basic metabolic panel      Status: Abnormal   Collection Time: 06/23/17  4:22 AM  Result Value Ref Range   Sodium 139 135 - 145 mmol/L   Potassium 3.5 3.5 - 5.1 mmol/L   Chloride 108 101 - 111 mmol/L   CO2 23 22 - 32 mmol/L   Glucose, Bld 104 (H) 65 - 99 mg/dL   BUN 11 6 - 20 mg/dL   Creatinine, Ser 0.55 0.44 - 1.00 mg/dL   Calcium 7.8 (L) 8.9 - 10.3 mg/dL   GFR calc non Af Amer >60 >60 mL/min   GFR calc Af Amer >60 >60 mL/min    Comment: (NOTE) The eGFR has been calculated using the CKD EPI equation. This calculation has not been validated in all clinical situations. eGFR's persistently <60 mL/min signify possible Chronic Kidney Disease.    Anion gap 8 5 - 15  Rapid HIV screen (HIV 1/2 Ab+Ag)     Status: None   Collection Time: 06/24/17  6:37 PM  Result Value Ref Range   HIV-1 P24 Antigen - HIV24 NON REACTIVE NON REACTIVE   HIV 1/2 Antibodies NON REACTIVE NON REACTIVE   Interpretation (HIV Ag Ab)      A non reactive test result means that HIV 1 or HIV 2 antibodies and HIV 1 p24 antigen were not detected in the specimen.   No components found for: ESR, C REACTIVE PROTEIN MICRO: Recent Results (from the past 720 hour(s))  Blood Culture (routine x 2)     Status: None (Preliminary result)   Collection Time: 06/22/17  5:05 AM  Result Value Ref Range Status   Specimen Description BLOOD RIGHT HAND  Final   Special Requests   Final    BOTTLES DRAWN AEROBIC AND ANAEROBIC Blood Culture adequate volume   Culture NO GROWTH 2 DAYS  Final   Report Status PENDING  Incomplete  Blood Culture (routine x 2)     Status: None (Preliminary result)   Collection Time: 06/22/17  5:05 AM  Result Value Ref Range Status   Specimen Description BLOOD LEFT ANTECUBITAL  Final   Special Requests   Final    BOTTLES DRAWN AEROBIC AND ANAEROBIC Blood Culture adequate volume   Culture NO GROWTH 2 DAYS  Final   Report Status PENDING  Incomplete    IMAGING: Dg Chest 2 View  Result Date: 06/22/2017 CLINICAL DATA:  Fever.   Vomiting. EXAM: CHEST  2 VIEW COMPARISON:  Lung bases from abdominal CT earlier this day. FINDINGS: Patchy consolidation in the right upper lobe. Linear right lower lobe atelectasis. Left lung is clear. Normal heart  size and mediastinal contours for technique. No pneumothorax or pleural effusion. No pulmonary edema. Remote left rib fractures. IMPRESSION: Patchy right upper lobe consolidation, this may be pneumonia or aspiration in the setting of vomiting. Linear right lower lobe atelectasis. Electronically Signed   By: Jeb Levering M.D.   On: 06/22/2017 04:15   Ct Head Wo Contrast  Result Date: 06/22/2017 CLINICAL DATA:  Acute onset of altered mental status. Nausea and vomiting. Personal history of pancreatic cancer. EXAM: CT HEAD WITHOUT CONTRAST TECHNIQUE: Contiguous axial images were obtained from the base of the skull through the vertex without intravenous contrast. COMPARISON:  CT of the head performed 01/11/2008 FINDINGS: Brain: No evidence of acute infarction, hemorrhage, hydrocephalus, extra-axial collection or mass lesion/mass effect. The posterior fossa, including the cerebellum, brainstem and fourth ventricle, is within normal limits. The third and lateral ventricles, and basal ganglia are unremarkable in appearance. The cerebral hemispheres are symmetric in appearance, with normal gray-white differentiation. No mass effect or midline shift is seen. Vascular: No hyperdense vessel or unexpected calcification. Skull: There is no evidence of fracture; visualized osseous structures are unremarkable in appearance. Sinuses/Orbits: The visualized portions of the orbits are within normal limits. There is partial opacification of the left maxillary sinus, right frontal sinus and ethmoid air cells. The remaining paranasal sinuses and mastoid air cells are well-aerated. Other: No significant soft tissue abnormalities are seen. IMPRESSION: 1. No acute intracranial pathology seen on CT. 2. Partial  opacification of the left maxillary sinus, right frontal sinus and ethmoid air cells. Electronically Signed   By: Garald Balding M.D.   On: 06/22/2017 04:32   Ct Angio Chest Pe W Or Wo Contrast  Result Date: 06/24/2017 CLINICAL DATA:  Acute hypoxic respiratory failure in the setting of aspiration pneumonia. History of breast cancer. EXAM: CT ANGIOGRAPHY CHEST WITH CONTRAST TECHNIQUE: Multidetector CT imaging of the chest was performed using the standard protocol during bolus administration of intravenous contrast. Multiplanar CT image reconstructions and MIPs were obtained to evaluate the vascular anatomy. CONTRAST:  92m ISOVUE-370 IOPAMIDOL (ISOVUE-370) INJECTION 76% COMPARISON:  CT chest dated August 13, 2015. FINDINGS: Cardiovascular: Satisfactory opacification of the pulmonary arteries to the segmental level. No evidence of pulmonary embolism. Normal heart size. No pericardial effusion. Normal caliber thoracic aorta. Mediastinum/Nodes: Prominent right paratracheal and hilar lymph nodes measuring up to 1.2 cm in short axis are likely reactive. No axillary lymphadenopathy. The thyroid gland, esophagus, and trachea demonstrate no significant abnormalities. Lungs/Pleura: Small bilateral pleural effusions. Extensive ground-glass density with more peripheral consolidative opacities in the right upper lobe. Right upper lobe peribronchial thickening. Bibasilar atelectasis. No suspicious pulmonary nodules. No pneumothorax. Upper Abdomen: No acute abnormality. Musculoskeletal: Prior left mastectomy. No acute or significant osseous findings. Old left-sided rib fractures. Review of the MIP images confirms the above findings. IMPRESSION: 1. No evidence of pulmonary embolism. 2. Right upper lobe pneumonia and/or aspiration. 3. Small bilateral pleural effusions. Electronically Signed   By: WTitus DubinM.D.   On: 06/24/2017 17:28   Ct Abdomen Pelvis W Contrast  Result Date: 06/22/2017 CLINICAL DATA:  Nausea and  vomiting. EXAM: CT ABDOMEN AND PELVIS WITH CONTRAST TECHNIQUE: Multidetector CT imaging of the abdomen and pelvis was performed using the standard protocol following bolus administration of intravenous contrast. CONTRAST:  1041mISOVUE-300 IOPAMIDOL (ISOVUE-300) INJECTION 61% COMPARISON:  CT 08/13/2015 FINDINGS: Lower chest: Elevated right hemidiaphragm with adjacent basilar atelectasis. Minimal right pleural thickening. No frank pleural effusion. Mild cardiomegaly. Post left mastectomy. Hepatobiliary: 11 mm subcapsular low-density lesion  in the left lobe of the liver, unchanged. Enlarged right lobe spanning 21 cm cranial caudal. No new hepatic lesion. Gallbladder physiologically distended, no calcified stone. No biliary dilatation. Pancreas: No ductal dilatation or inflammation. Patient with reported history of pancreatic cancer, no visualized pancreatic mass. Spleen: Normal in size without focal abnormality. Adrenals/Urinary Tract: No adrenal nodule. No hydronephrosis or perinephric edema. Homogeneous renal enhancement with symmetric excretion on delayed phase imaging. Tiny low-density lesions in the left kidney are too small to accurately characterize. Urinary bladder is physiologically distended without wall thickening. Stomach/Bowel: Lack of enteric contrast limits bowel assessment. Small hiatal hernia and wall thickening of the distal esophagus. Stomach is nondistended. No evidence of bowel wall thickening, inflammation or obstruction. Transverse and descending colon are nondistended and not well assessed. Normal appendix. Vascular/Lymphatic: Normal caliber abdominal aorta. No enlarged abdominal or pelvic lymph nodes. Reproductive: Status post hysterectomy. No adnexal masses. Other: No free air or ascites. No intra-abdominal fluid collection. Upper abdominal rectus diastases. Musculoskeletal: Remote left rib fractures. Degenerative change of both sacroiliac joints. There are no acute or suspicious osseous  abnormalities. IMPRESSION: 1. Small hiatal hernia with distal esophageal wall thickening, may be reflux or esophagitis. 2. No additional acute abnormality in the abdomen or pelvis. Electronically Signed   By: Jeb Levering M.D.   On: 06/22/2017 02:08    Assessment:   ERYNN VACA is a 56 y.o. female with ETOH abuse admitted with fevers, nausea vomiting, cough and abd pain. On imaging has RUL PNA with possible aspiration. Her HIV test is negative. Sputum and MRSA PCR pending.   Recommendations Check sputum cx - ordered  Check MRSA PCR - ordered - if positve add vanco.  Check strep PNA and legionella urinary antigen Would cover for atypicals as well with azithromyin x 5 days.  Cover for aspiration. -PCN allergic, but is tolerating meropenem.  Check QFG Thank you very much for allowing me to participate in the care of this patient. Please call with questions.   Cheral Marker. Ola Spurr, MD

## 2017-06-25 ENCOUNTER — Inpatient Hospital Stay
Admit: 2017-06-25 | Discharge: 2017-06-25 | Disposition: A | Discharge status: Home / Self Care | Payer: Medicare Other | Attending: Internal Medicine | Admitting: Internal Medicine

## 2017-06-25 LAB — MRSA PCR SCREENING: MRSA by PCR: NEGATIVE

## 2017-06-25 LAB — ECHOCARDIOGRAM COMPLETE
Height: 62 in
WEIGHTICAEL: 2462.4 [oz_av]

## 2017-06-25 NOTE — Progress Notes (Signed)
[place] at [facility] was admitted to the [facility] on 06/15/2017 and still in hospital.  Please excuse Trey Paula for visiting her on  06/25/17.    Call Dustin Flock MD with questions.  Dustin Flock M.D on 06/25/2017,at 1:38 PM  [place] [facility] [place]

## 2017-06-25 NOTE — Progress Notes (Signed)
[facility] INFECTIOUS DISEASE PROGRESS NOTE Date of Admission:  07/04/2017     ID: Alexis Hernandez is a 56 y.o. female with aspiration pna Active Problems:   Aspiration pneumonia (HCC)   Pneumonia   Subjective: Feels much better. No fevers. Cough improving.   ROS  Eleven systems are reviewed and negative except per hpi  Medications:  Antibiotics Given (last 72 hours)    Date/Time Action Medication Dose Rate   06/22/17 2125 New Bag/Given   clindamycin (CLEOCIN) IVPB 300 mg 300 mg 100 mL/hr   06/23/17 0527 New Bag/Given   clindamycin (CLEOCIN) IVPB 300 mg 300 mg 100 mL/hr   06/23/17 1020 New Bag/Given   meropenem (MERREM) 1 g in sodium chloride 0.9 % 100 mL IVPB 1 g 200 mL/hr   06/23/17 1503 New Bag/Given   meropenem (MERREM) 1 g in sodium chloride 0.9 % 100 mL IVPB 1 g 200 mL/hr   06/23/17 2138 New Bag/Given   meropenem (MERREM) 1 g in sodium chloride 0.9 % 100 mL IVPB 1 g 200 mL/hr   06/24/17 0520 New Bag/Given   meropenem (MERREM) 1 g in sodium chloride 0.9 % 100 mL IVPB 1 g 200 mL/hr   06/24/17 1756 New Bag/Given   meropenem (MERREM) 1 g in sodium chloride 0.9 % 100 mL IVPB 1 g 200 mL/hr   06/24/17 2201 Given   azithromycin (ZITHROMAX) tablet 500 mg 500 mg    06/25/17 0112 New Bag/Given   meropenem (MERREM) 1 g in sodium chloride 0.9 % 100 mL IVPB 1 g 200 mL/hr   06/25/17 1126 Given   azithromycin (ZITHROMAX) tablet 250 mg 250 mg    06/25/17 1131 New Bag/Given   meropenem (MERREM) 1 g in sodium chloride 0.9 % 100 mL IVPB 1 g 200 mL/hr     . azithromycin  250 mg Oral Daily  . docusate sodium  100 mg Oral BID  . enoxaparin (LOVENOX) injection  40 mg Subcutaneous Q24H  . folic acid  1 mg Oral Daily  . LORazepam  0-4 mg Oral Q12H  . multivitamin with minerals  1 tablet Oral Daily  . thiamine  100 mg Oral Daily   Or  . thiamine  100 mg Intravenous Daily    Objective: Vital signs in last 24 hours: Temp:  [98.3 F (36.8 C)-101 F (38.3 C)] 98.4 F (36.9 C)  (12/18 1434) Pulse Rate:  [65-89] 72 (12/18 1434) Resp:  [16-24] 19 (12/18 0508) BP: (127-156)/(60-79) 156/79 (12/18 1434) SpO2:  [95 %-98 %] 97 % (12/18 1434) Weight:  [69.8 kg (153 lb 14.4 oz)] 69.8 kg (153 lb 14.4 oz) (12/18 0508) Constitutional:  oriented to person, place, and time. Disheveled, chronically ill appearing,  HENT: [place]/AT, PERRLA, no scleral icterus Mouth/Throat: Oropharynx is clear and dry . No oropharyngeal exudate.  Cardiovascular: Normal rate, regular rhythm and normal heart sounds. Pulmonary/Chest:bil rhonchi  Neck = supple, no nuchal rigidity Abdominal: Soft. Bowel sounds are normal.  exhibits no distension. There is no tenderness.  Lymphadenopathy: no cervical adenopathy. No axillary adenopathy Neurological: alert and oriented to person, place, and time.  Skin: Skin is warm and dry. No rash noted. No erythema.  Psychiatric: a normal mood and affect.  behavior is normal.     Lab Results Recent Labs    06/23/17 0422  NA 139  K 3.5  CL 108  CO2 23  BUN 11  CREATININE 0.55    Microbiology: Results for orders placed or performed during the hospital encounter  of 07/04/2017  Blood Culture (routine x 2)     Status: None (Preliminary result)   Collection Time: 06/22/17  5:05 AM  Result Value Ref Range Status   Specimen Description BLOOD RIGHT HAND  Final   Special Requests   Final    BOTTLES DRAWN AEROBIC AND ANAEROBIC Blood Culture adequate volume   Culture NO GROWTH 3 DAYS  Final   Report Status PENDING  Incomplete  Blood Culture (routine x 2)     Status: None (Preliminary result)   Collection Time: 06/22/17  5:05 AM  Result Value Ref Range Status   Specimen Description BLOOD LEFT ANTECUBITAL  Final   Special Requests   Final    BOTTLES DRAWN AEROBIC AND ANAEROBIC Blood Culture adequate volume   Culture NO GROWTH 3 DAYS  Final   Report Status PENDING  Incomplete  MRSA PCR Screening     Status: None   Collection Time: 06/24/17  6:30 AM  Result Value  Ref Range Status   MRSA by PCR NEGATIVE NEGATIVE Final    Comment:        The GeneXpert MRSA Assay (FDA approved for NASAL specimens only), is one component of a comprehensive MRSA colonization surveillance program. It is not intended to diagnose MRSA infection nor to guide or monitor treatment for MRSA infections.     Studies/Results: Ct Angio Chest Pe W Or Wo Contrast  Result Date: 06/24/2017 CLINICAL DATA:  Acute hypoxic respiratory failure in the setting of aspiration pneumonia. History of breast cancer. EXAM: CT ANGIOGRAPHY CHEST WITH CONTRAST TECHNIQUE: Multidetector CT imaging of the chest was performed using the standard protocol during bolus administration of intravenous contrast. Multiplanar CT image reconstructions and MIPs were obtained to evaluate the vascular anatomy. CONTRAST:  52mL ISOVUE-370 IOPAMIDOL (ISOVUE-370) INJECTION 76% COMPARISON:  CT chest dated August 13, 2015. FINDINGS: Cardiovascular: Satisfactory opacification of the pulmonary arteries to the segmental level. No evidence of pulmonary embolism. Normal heart size. No pericardial effusion. Normal caliber thoracic aorta. Mediastinum/Nodes: Prominent right paratracheal and hilar lymph nodes measuring up to 1.2 cm in short axis are likely reactive. No axillary lymphadenopathy. The thyroid gland, esophagus, and trachea demonstrate no significant abnormalities. Lungs/Pleura: Small bilateral pleural effusions. Extensive ground-glass density with more peripheral consolidative opacities in the right upper lobe. Right upper lobe peribronchial thickening. Bibasilar atelectasis. No suspicious pulmonary nodules. No pneumothorax. Upper Abdomen: No acute abnormality. Musculoskeletal: Prior left mastectomy. No acute or significant osseous findings. Old left-sided rib fractures. Review of the MIP images confirms the above findings. IMPRESSION: 1. No evidence of pulmonary embolism. 2. Right upper lobe pneumonia and/or aspiration. 3.  Small bilateral pleural effusions. Electronically Signed   By: Titus Dubin M.D.   On: 06/24/2017 17:28    Assessment/Plan: Alexis Hernandez is a 56 y.o. female with ETOH abuse admitted with fevers, nausea vomiting, cough and abd pain. On imaging has RUL PNA with possible aspiration. Her HIV test is negative. Sputum pending. MRSA PCR negative.  Pending strep PNA and legionella urinary antigen  Recommendations  Cont meropenem and azitrho for now.   Check QFG   Thank you very much for the consult. Will follow with you.  Leonel Ramsay   06/25/2017, 2:59 PM

## 2017-06-25 NOTE — Progress Notes (Signed)
[facility] at [place] NAME: Kalynne Womac    MR#:  [id number]  DATE OF BIRTH:  06-Feb-1961  SUBJECTIVE:   Patient feeling better fevers have resolved today  REVIEW OF SYSTEMS:    Review of Systems  Constitutional: Negative for fever, chills weight loss HENT: Negative for ear pain, nosebleeds, congestion, facial swelling, rhinorrhea, neck pain, neck stiffness and ear discharge.   Respiratory: Improved coughing Cardiovascular: Negative for chest pain, palpitations and leg swelling.  Gastrointestinal: Negative for heartburn, abdominal pain, vomiting, diarrhea or consitpation Genitourinary: Negative for dysuria, urgency, frequency, hematuria Musculoskeletal: Negative for back pain or joint pain Neurological: Negative for dizziness, seizures, syncope, focal weakness,  numbness and headaches.  Hematological: Does not bruise/bleed easily.  Psychiatric/Behavioral: Negative for hallucinations, confusion, dysphoric mood    Tolerating Diet: yes      DRUG ALLERGIES:   Allergies  Allergen Reactions  . Penicillins Swelling    VITALS:  Blood pressure (!) 156/79, pulse 72, temperature 98.4 F (36.9 C), temperature source Oral, resp. rate 19, height 5\' 2"  (1.575 m), weight 153 lb 14.4 oz (69.8 kg), SpO2 97 %.  PHYSICAL EXAMINATION:  Constitutional: Appears well-developed and well-nourished. No distress. HENT: Normocephalic. Marland Kitchen Oropharynx is clear and moist.  Eyes: Conjunctivae and EOM are normal. PERRLA, no scleral icterus.  Neck: Normal ROM. Neck supple. No JVD. No tracheal deviation. CVS: RRR, S1/S2 +, no murmurs, no gallops, no carotid bruit.  Pulmonary: Right lungs rhonchus breath sounds Abdominal: Soft. BS +,  no distension, tenderness, rebound or guarding.  Musculoskeletal: Normal range of motion. No edema and no tenderness.  Neuro: Alert. CN 2-12 grossly intact. No focal deficits. Skin: Skin is warm and dry. No rash noted. Psychiatric:  Normal mood and affect.      LABORATORY PANEL:   CBC Recent Labs  Lab 06/20/2017 2306  WBC 11.1*  HGB 13.2  HCT 40.1  PLT 207   ------------------------------------------------------------------------------------------------------------------  Chemistries  Recent Labs  Lab 07/07/2017 2306 06/23/17 0422  NA 142 139  K 3.6 3.5  CL 105 108  CO2 24 23  GLUCOSE 117* 104*  BUN 10 11  CREATININE 0.72 0.55  CALCIUM 9.4 7.8*  AST 56*  --   ALT 37  --   ALKPHOS 108  --   BILITOT 0.7  --    ------------------------------------------------------------------------------------------------------------------  Cardiac Enzymes No results for input(s): TROPONINI in the last 168 hours. ------------------------------------------------------------------------------------------------------------------  RADIOLOGY:  Ct Angio Chest Pe W Or Wo Contrast  Result Date: 06/24/2017 CLINICAL DATA:  Acute hypoxic respiratory failure in the setting of aspiration pneumonia. History of breast cancer. EXAM: CT ANGIOGRAPHY CHEST WITH CONTRAST TECHNIQUE: Multidetector CT imaging of the chest was performed using the standard protocol during bolus administration of intravenous contrast. Multiplanar CT image reconstructions and MIPs were obtained to evaluate the vascular anatomy. CONTRAST:  19mL ISOVUE-370 IOPAMIDOL (ISOVUE-370) INJECTION 76% COMPARISON:  CT chest dated August 13, 2015. FINDINGS: Cardiovascular: Satisfactory opacification of the pulmonary arteries to the segmental level. No evidence of pulmonary embolism. Normal heart size. No pericardial effusion. Normal caliber thoracic aorta. Mediastinum/Nodes: Prominent right paratracheal and hilar lymph nodes measuring up to 1.2 cm in short axis are likely reactive. No axillary lymphadenopathy. The thyroid gland, esophagus, and trachea demonstrate no significant abnormalities. Lungs/Pleura: Small bilateral pleural effusions. Extensive ground-glass density with  more peripheral consolidative opacities in the right upper lobe. Right upper lobe peribronchial thickening. Bibasilar atelectasis. No suspicious pulmonary nodules. No pneumothorax. Upper Abdomen: No acute  abnormality. Musculoskeletal: Prior left mastectomy. No acute or significant osseous findings. Old left-sided rib fractures. Review of the MIP images confirms the above findings. IMPRESSION: 1. No evidence of pulmonary embolism. 2. Right upper lobe pneumonia and/or aspiration. 3. Small bilateral pleural effusions. Electronically Signed   By: Titus Dubin M.D.   On: 06/24/2017 17:28     ASSESSMENT AND PLAN:   56 year old female with a history of asthma, mild intermittent EtOH abuse who presents with nausea.  1. Acute hypoxic respiratory failure in the setting of aspiration pneumonia Wean oxygen as tolerated Continue meropenem And azithromycin   2. Aspiration pneumonia with persistent fever: Continue meropenem and azithromycin Infectious disease consult due to persistent fevers Flu test was negative HIV negative   3. EtOH abuse: Continue CIWA protocol no evidence of withdrawal  4. Mild intermittent asthma without signs of exacerbation  5. History of breast cancer: Patient not compliant with ARIMEDEX       Management plans discussed with the patient and she is in agreement.  CODE STATUS: full  TOTAL TIME TAKING CARE OF THIS PATIENT: 25 minutes.     POSSIBLE D/C 1-2 days, DEPENDING ON CLINICAL CONDITION.   Dustin Flock M.D on 06/25/2017 at 3:11 PM  Between 7am to 6pm - Pager - [phone] After 6pm go to [email or web address] - password [email or web address] [facility]  Office  [phone]  CC: Primary care physician; Patient, No Pcp Per  Note: This dictation was prepared with Dragon dictation along with smaller phrase technology. Any transcriptional errors that result from this process are unintentional.

## 2017-06-25 NOTE — Progress Notes (Signed)
*  PRELIMINARY RESULTS* Echocardiogram 2D Echocardiogram has been performed.  [name] 06/25/2017, 8:52 AM

## 2017-06-26 LAB — GLUCOSE, CAPILLARY: Glucose-Capillary: 153 mg/dL — ABNORMAL HIGH (ref 65–99)

## 2017-06-27 LAB — CULTURE, BLOOD (ROUTINE X 2)
Culture: NO GROWTH
Culture: NO GROWTH
SPECIAL REQUESTS: ADEQUATE
Special Requests: ADEQUATE

## 2017-06-27 LAB — QUANTIFERON-TB GOLD PLUS (RQFGPL)
QUANTIFERON NIL VALUE: 0.05 [IU]/mL
QUANTIFERON TB2 AG VALUE: 0.04 [IU]/mL
QuantiFERON Mitogen Value: 1.29 IU/mL
QuantiFERON TB1 Ag Value: 0.05 IU/mL

## 2017-06-27 LAB — QUANTIFERON-TB GOLD PLUS: QUANTIFERON-TB GOLD PLUS: NEGATIVE

## 2017-07-03 ENCOUNTER — Inpatient Hospital Stay: Admission: RE | Admit: 2017-07-03 | Payer: Medicaid Other | Source: Ambulatory Visit

## 2017-07-03 ENCOUNTER — Other Ambulatory Visit: Payer: Medicaid Other

## 2017-07-09 NOTE — Progress Notes (Signed)
Pt Coded; CH was present for code, Pt died.  AC called all known family members with no success.   Family arrived; [facility] met them at the elevator, and the large family began their grieving processes.  Intense emotions and outbursts were part of that as the death was very unexpected.  [facility] provided strong and loving presence, prayer, safety, and crowd control for the duration.

## 2017-07-09 NOTE — Progress Notes (Signed)
ETT removed for family viewing per RN request

## 2017-07-09 NOTE — Progress Notes (Signed)
Pt family notified.

## 2017-07-09 NOTE — Progress Notes (Signed)
Patient's daughter, Scot Dock, picked up patient's jewelry from [facility] nurse's station 29-Jun-2017 1315

## 2017-07-09 NOTE — Progress Notes (Signed)
Called to code blue; patient pulseless and not breathing. Last known responsive 1 hr prior when sleeping comfortably. CPR initiated prior to my arrival. Epi x1 given; second intubation attempt successful with glidoscope. Breath sounds and color change achieved. Gross hemoptysis quickly seen. Epi x2 more plus bicarb and calcium given. Pulse checked with Doppler on multiple occasions. Patient never had ROC. Resuscitation efforts ceased after 14 minutes. Time of death 59.

## 2017-07-09 NOTE — ED Provider Notes (Signed)
[facility]  Department of Emergency Medicine   Code Blue CONSULT NOTE  Chief Complaint: Cardiac arrest/unresponsive   Level V Caveat: Unresponsive  History of present illness: I was contacted by the hospital for a CODE BLUE cardiac arrest upstairs and presented to the patient's bedside.   Dr. Marcille Blanco from hospitalist services already running the code.  ROS: Unable to obtain, Level V caveat  Scheduled Meds: . azithromycin  250 mg Oral Daily  . docusate sodium  100 mg Oral BID  . enoxaparin (LOVENOX) injection  40 mg Subcutaneous Q24H  . folic acid  1 mg Oral Daily  . LORazepam  0-4 mg Oral Q12H  . multivitamin with minerals  1 tablet Oral Daily  . thiamine  100 mg Oral Daily   Or  . thiamine  100 mg Intravenous Daily   Continuous Infusions: . meropenem (MERREM) IV Stopped (06/25/17 2034)   PRN Meds:.acetaminophen **OR** acetaminophen, benzonatate, guaiFENesin, ondansetron **OR** ondansetron (ZOFRAN) IV Past Medical History:  Diagnosis Date  . Asthma   . Breast cancer ([place]) 2016   left breast  . Cancer of left female breast  ([place])    breast  . Pancreatic cancer [facility])    Past Surgical History:  Procedure Laterality Date  . ABDOMINAL HYSTERECTOMY    . MASTECTOMY Left 09/30/14  . SENTINEL NODE BIOPSY     Social History   Socioeconomic History  . Marital status: Single    Spouse name: Not on file  . Number of children: Not on file  . Years of education: Not on file  . Highest education level: Not on file  Social Needs  . Financial resource strain: Not on file  . Food insecurity - worry: Not on file  . Food insecurity - inability: Not on file  . Transportation needs - medical: Not on file  . Transportation needs - non-medical: Not on file  Occupational History  . Not on file  Tobacco Use  . Smoking status: Never Smoker  . Smokeless tobacco: Never Used  Substance and Sexual Activity  . Alcohol use: Yes  . Drug use: No  . Sexual activity: Not  on file  Other Topics Concern  . Not on file  Social History Narrative  . Not on file   Allergies  Allergen Reactions  . Penicillins Swelling    Last set of Vital Signs (not current) Vitals:   06/25/17 1434 06/25/17 2024  BP: (!) 156/79 (!) 150/76  Pulse: 72 71  Resp:  18  Temp: 98.4 F (36.9 C) 99.5 F (37.5 C)  SpO2: 97% 98%      Physical Exam  Gen: unresponsive Cardiovascular: pulseless  Resp: apneic. Breath sounds equal bilaterally with bagging  Abd: nondistended  Neuro: GCS 3, unresponsive to pain  HEENT: No blood in posterior pharynx, gag reflex absent  Neck: No crepitus  Musculoskeletal: No deformity  Skin: warm  Procedures  INTUBATION Performed by: Respiratory therapist under my direct supervision. Required items: required blood products, implants, devices, and special equipment available Patient identity confirmed: provided demographic data and hospital-assigned identification number Time out: Immediately prior to procedure a "time out" was called to verify the correct patient, procedure, equipment, support staff and site/side marked as required. Indications: Cardiac arrest Intubation method: Glidescope Preoxygenation: BVM Sedatives: None Paralytic: None Tube Size: 7.5 cuffed Post-procedure assessment: chest rise and ETCO2 monitor Breath sounds: equal and absent over the epigastrium Tube secured by Respiratory Therapy Successful third attempt by RT.  Prior 2 attempts  unsuccessful using direct laryngoscopy      Paulette Blanch, MD July 19, 2017 [phone]

## 2017-07-09 NOTE — Discharge Summary (Signed)
[facility] at [facility] Date of Admission: 17-Jul-2017 11:05 PM  Date of death:07/22/2017   Admitting diagnosis:  Aspiration pneumonia Dehydration Alcohol abuse  Diagnosis at time of death 1.  Status post cardiac arrest 2.  Gross hemoptysis 3.  Alcohol abuse 4.  Mild intermittent asthma 5.  History of breast cancer    Hospital course The patient with past medical history of breast cancer and remote pancreatic cancer presents to the emergency department due to nausea and vomiting.  Patient had admitted she drank lot of alcohol on the day of admission.  Patient also reported upper epigastric pain with occasional shortness of breath.  Patient on arrival was noted to have mildly hypoxic chest x-ray showed pneumonia.  Patient was placed on aztreonam due to concern for aspiration pneumonia.  Patient continued to have fever therefore infectious disease consult was obtained.  And has she had a CT scan of the chest.  Her antibiotics were adjusted per infectious disease recommendations.  She was doing well on 06/24/2017.  Plan was for her to be discharged to home today.  However earlier this morning patient was found pulseless and not breathing.  Dr. Legrand Como diamond's who was present in the hospital attended to her code.  Patient was noted to have gross hemoptysis which she had not complained of for was noted to have previously.  CPR was performed.  She never had ROC.  Patient was coded for about 14 minutes.            TOTAL TIME TAKING CARE OF THIS PATIENT: 35 minutes.    Dustin Flock M.D on 07/22/17 at 8:36 AM  Between 7am to 6pm - Pager - [phone]  After 6pm go to [email or web address] - password [email or web address] [facility]  Office  [phone]  CC: Primary care physician; Patient, No Pcp Per

## 2017-07-09 NOTE — Progress Notes (Signed)
Patient's remains prepared for transport to morgue.  Jewelry (nine rings) removed from patient.  Daughter, Scot Dock, notified at [phone] and states she will come to nurse's station to pick them up.

## 2017-07-09 NOTE — Progress Notes (Signed)
Walked in pt room at 2:16am to hang AM antibiotics. PT observed non responsive, no pulse, no respirations. Code blue initiated. Time of death 65. Nurse supervisor attempted to notify family. No success. Will make more attempts.

## 2017-07-09 DEATH — deceased

## 2017-10-13 IMAGING — CR DG ELBOW COMPLETE 3+V*R*
1 series · 5 of 5 positions shown · non-contrast
Comparison: None.

CLINICAL DATA: Fall 2 days ago with right elbow injury. Initial
encounter.

EXAM:
RIGHT ELBOW - COMPLETE 3+ VIEW

[Series 1: x elbow lat right · 0.14mm/px · 5 of 5 slices shown]
[im 1/5]
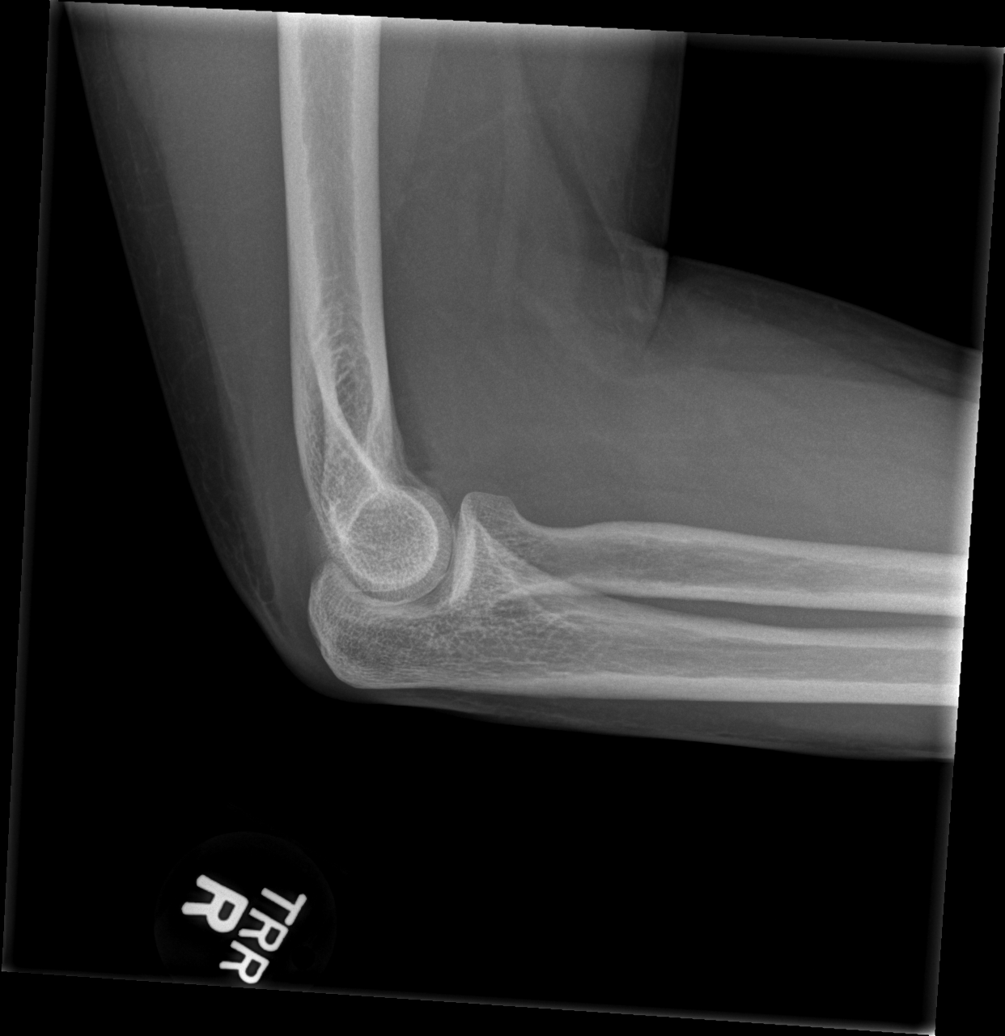
[im 2/5]
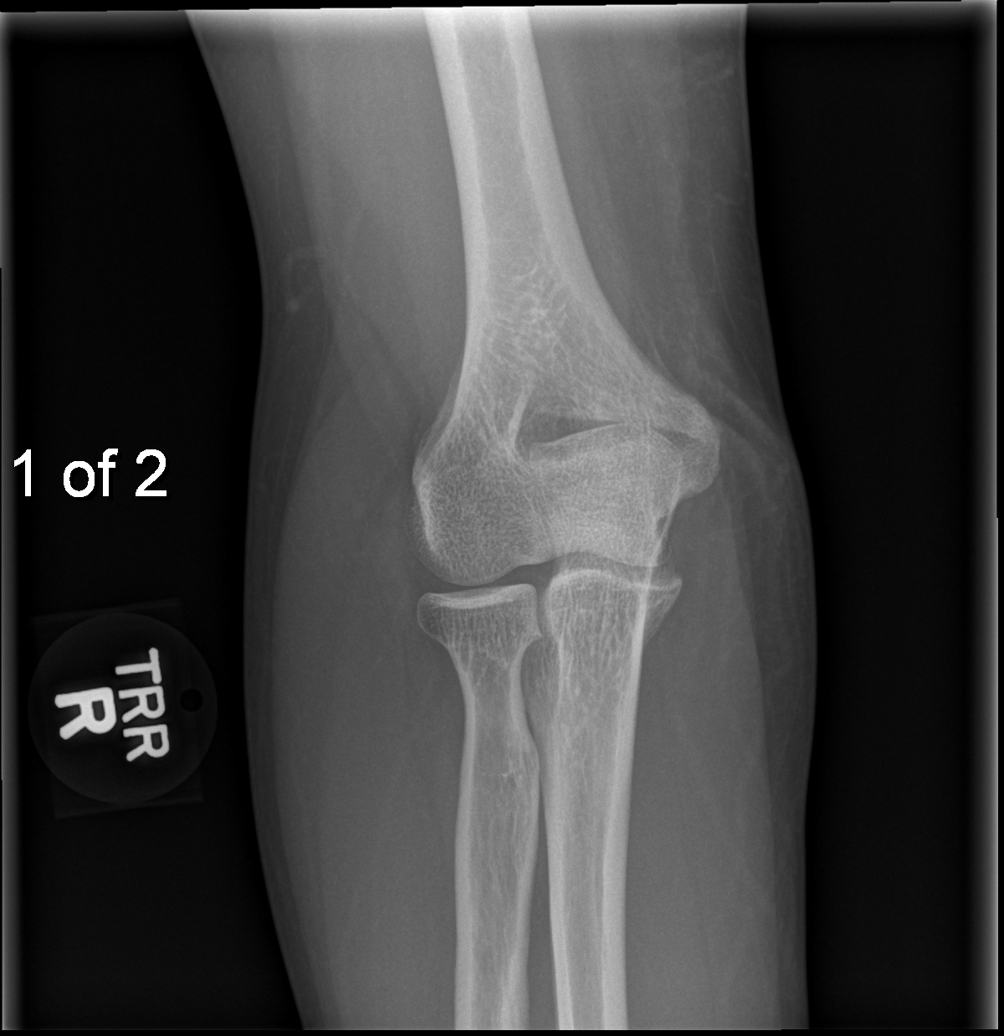
[im 3/5]
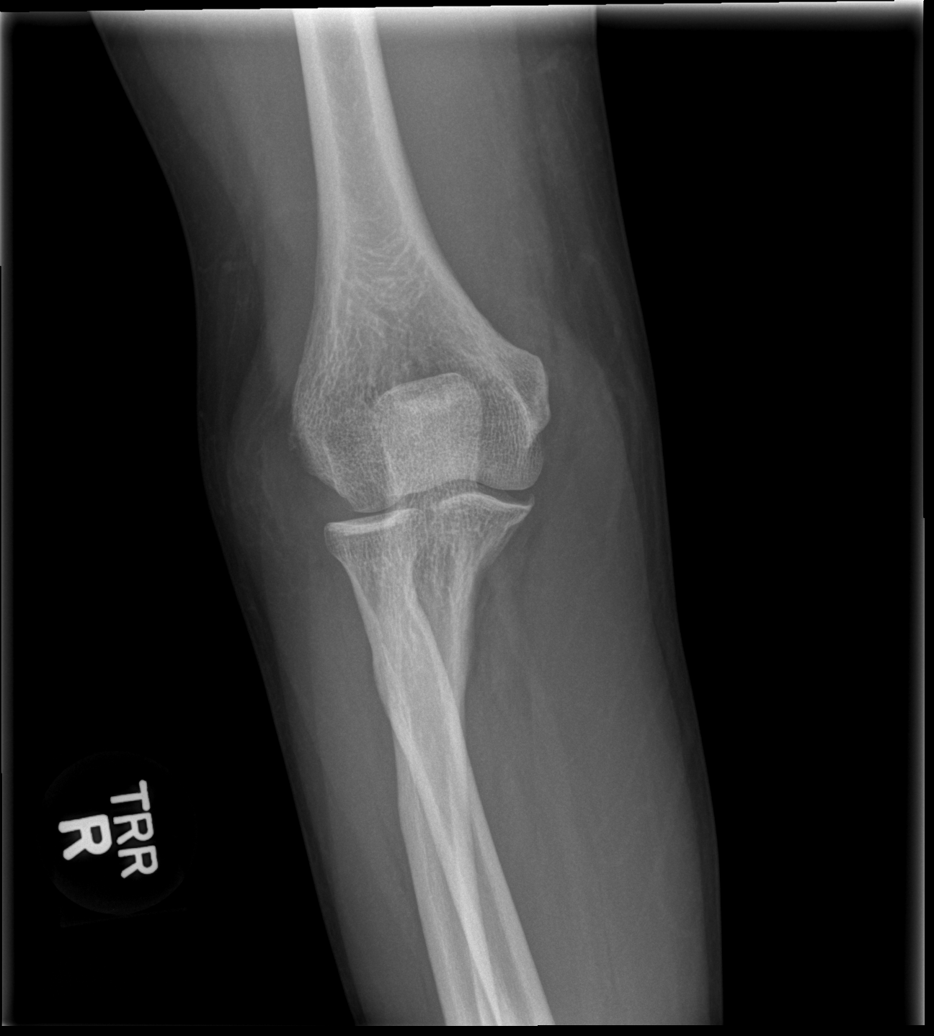
[im 4/5]
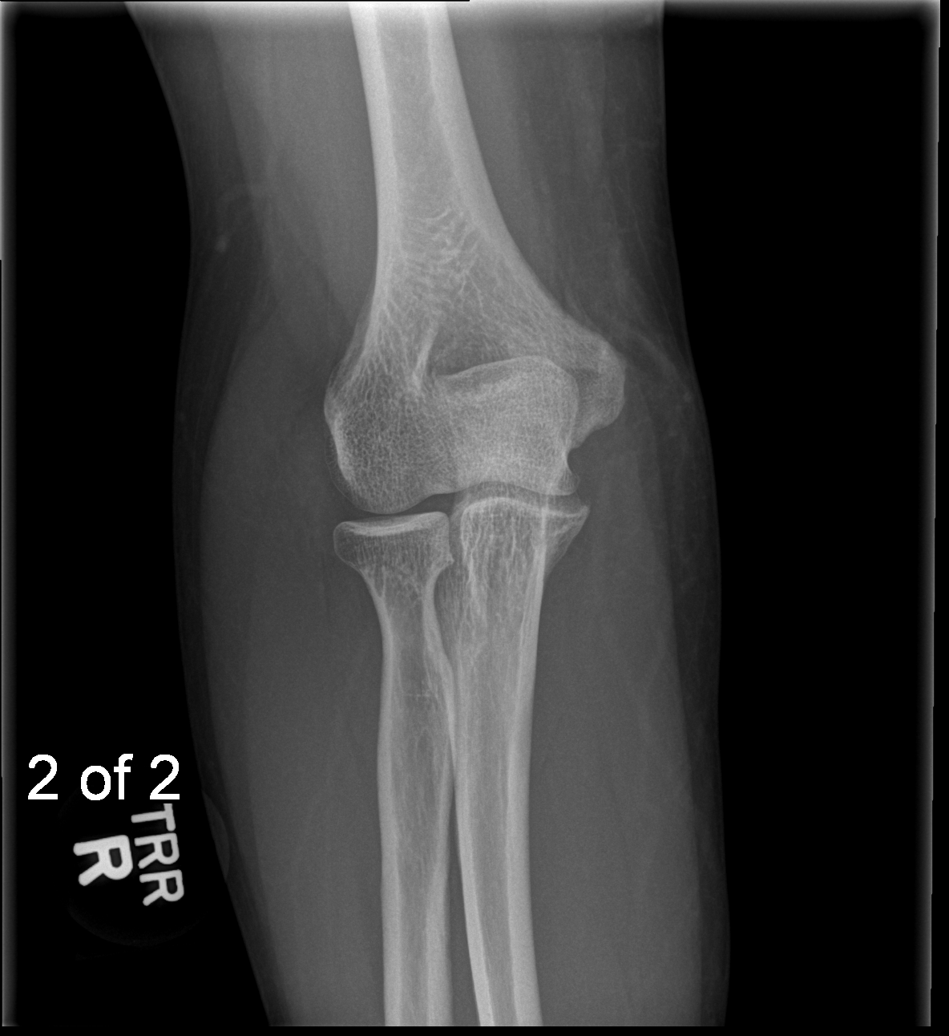
[im 5/5]
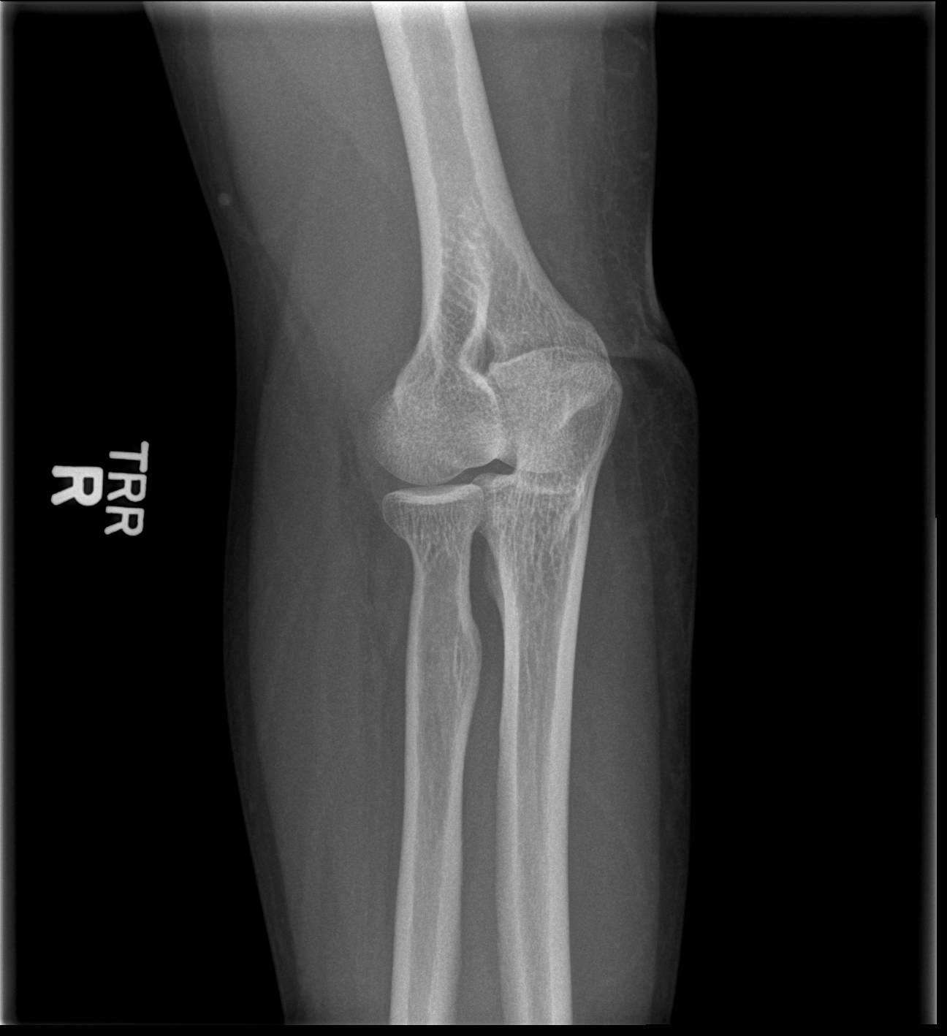

[5 of 5 positions shown; findings below may reference images not displayed]

FINDINGS: There is no evidence of fracture, dislocation, or joint effusion.
There is no evidence of arthropathy or other focal bone abnormality.
Soft tissues are unremarkable.
IMPRESSION: Negative.

## 2017-10-13 IMAGING — CR DG KNEE COMPLETE 4+V*L*
1 series · 4 of 4 positions shown · non-contrast
Comparison: None.

CLINICAL DATA: Fall 2 days ago with severe pain. Left posterior
knee swelling. Initial encounter.

EXAM:
LEFT KNEE - COMPLETE 4+ VIEW

[Series 1: x knee ap left · 0.14mm/px · 4 of 4 slices shown]
[im 1/4]
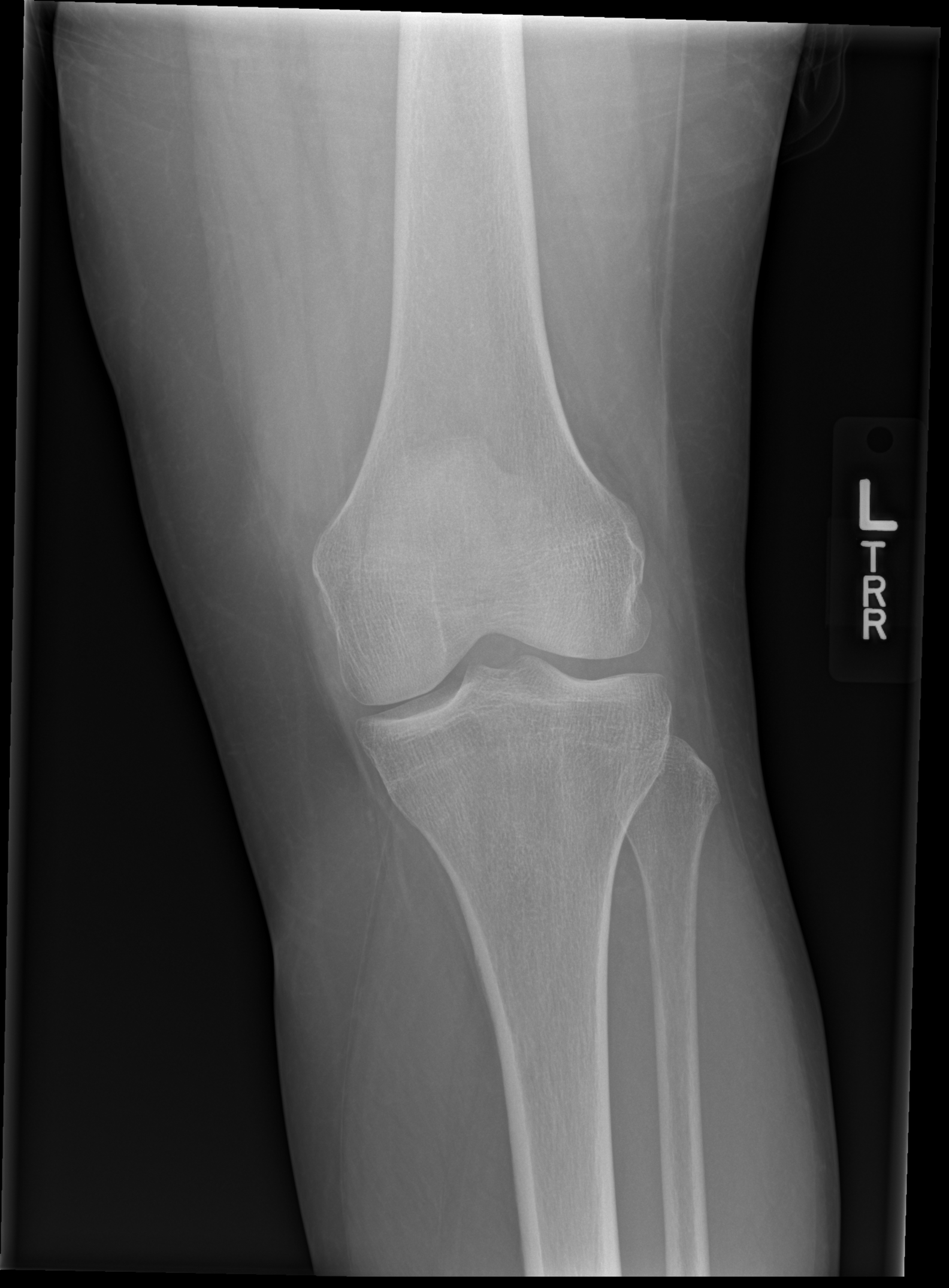
[im 2/4]
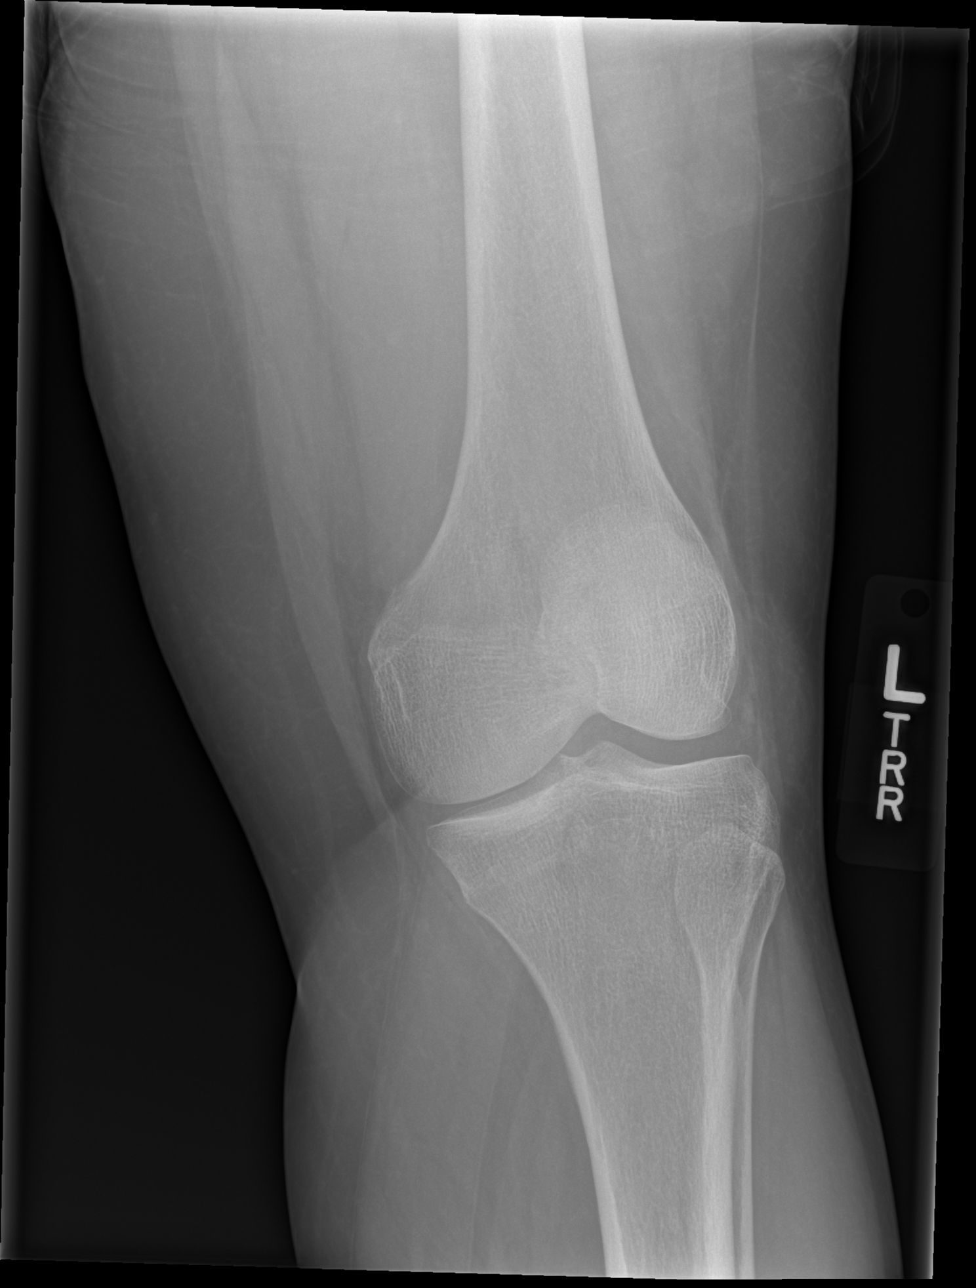
[im 3/4]
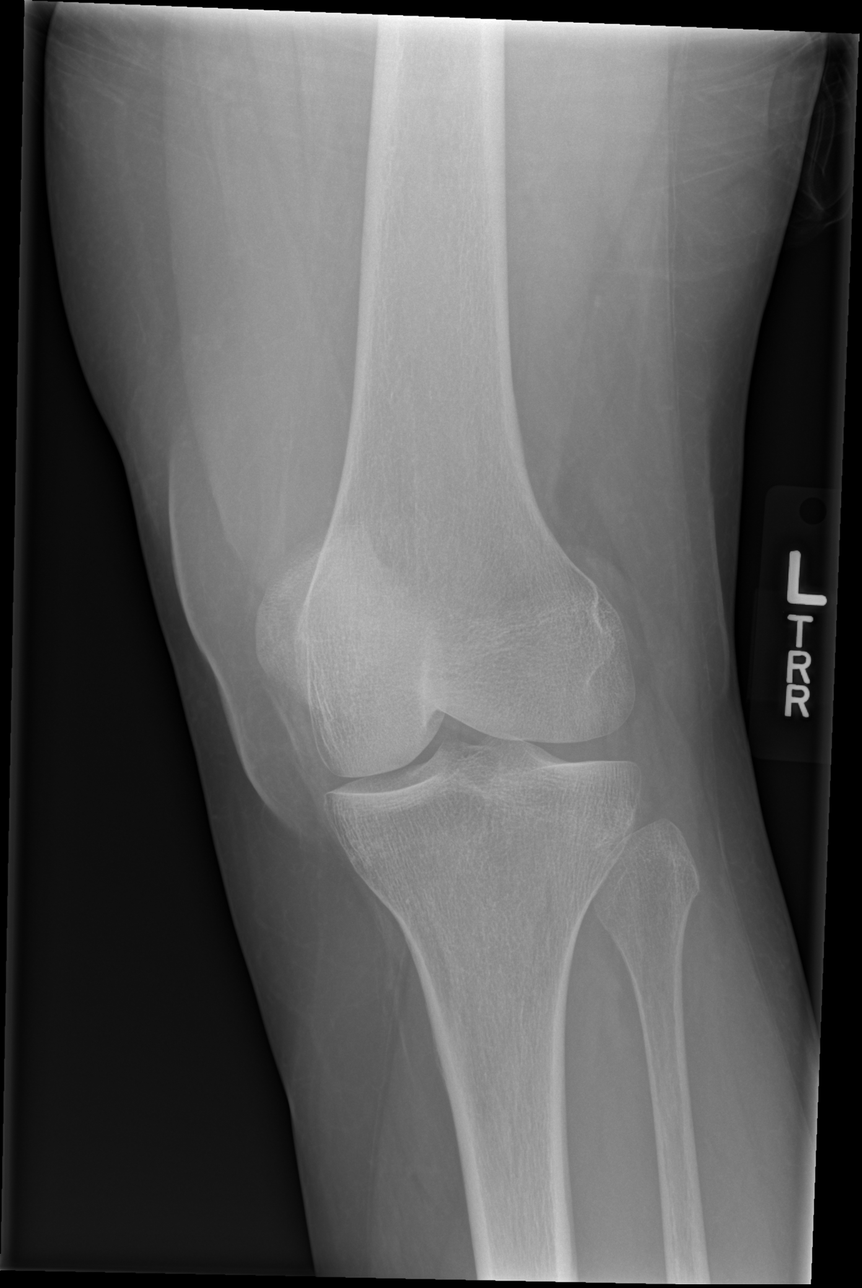
[im 4/4]
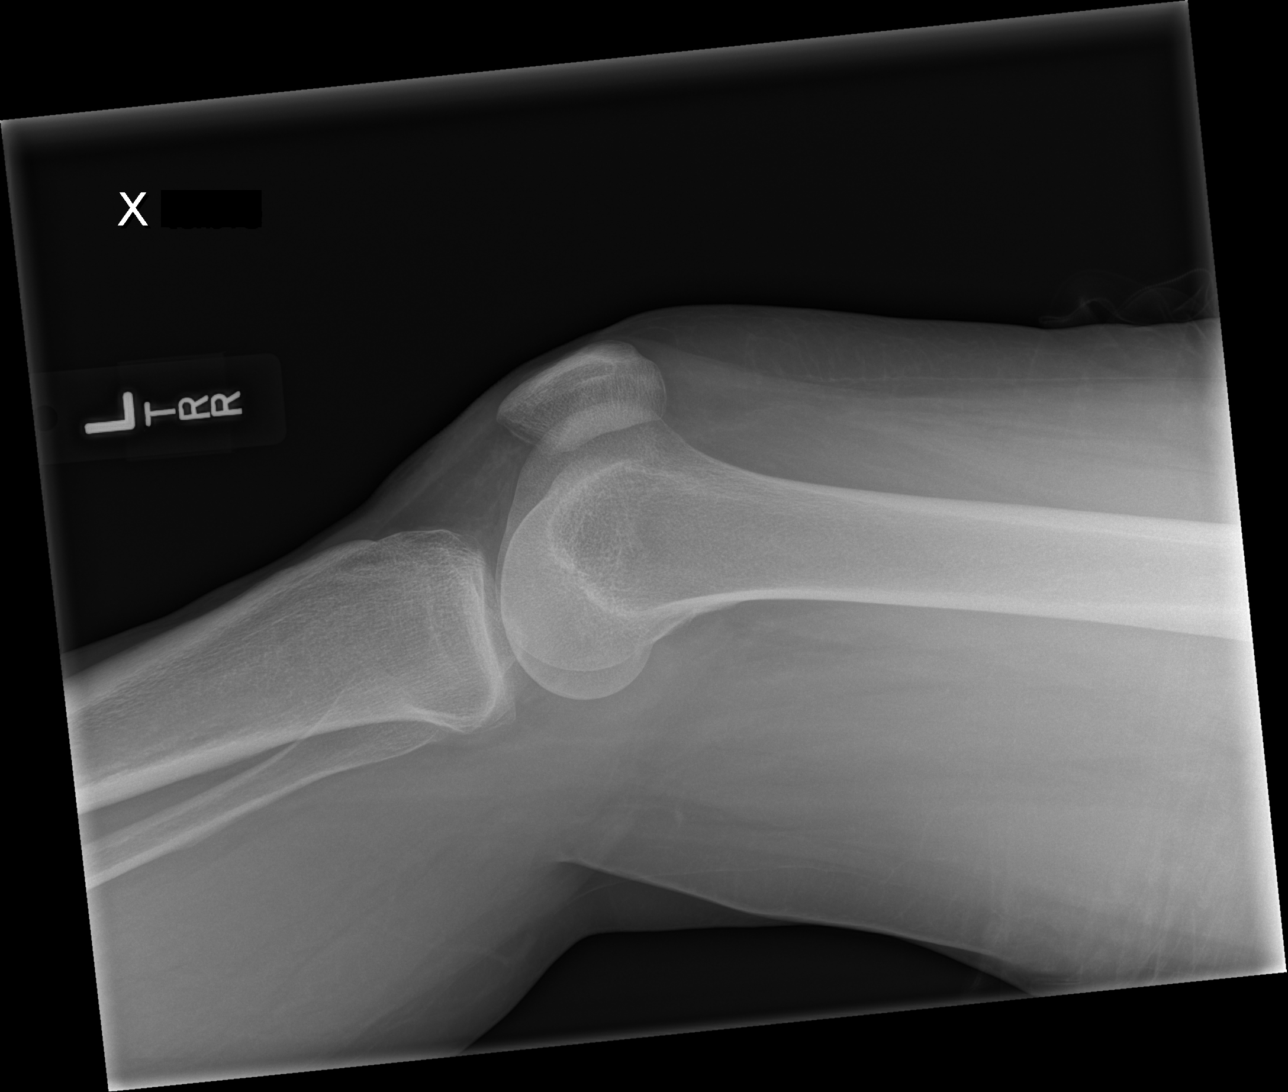

[4 of 4 positions shown; findings below may reference images not displayed]

FINDINGS: There is no evidence of fracture, dislocation, or joint effusion.
There is no evidence of arthropathy or other focal bone abnormality.
Soft tissues are unremarkable.
IMPRESSION: Negative.
